# Patient Record
Sex: Female | Born: 1965 | Race: Black or African American | Hispanic: No | Marital: Single | Smoking: Heavy tobacco smoker
Health system: Southern US, Community
[De-identification: ages and names within clinical notes are randomized; demographics above are authoritative.]

## PROBLEM LIST (undated history)

## (undated) DIAGNOSIS — I10 Essential (primary) hypertension: Secondary | ICD-10-CM

## (undated) HISTORY — PX: SKIN GRAFT: SHX250

## (undated) HISTORY — PX: KNEE SURGERY: SHX244

---

## 2019-09-23 ENCOUNTER — Ambulatory Visit: Payer: Self-pay | Attending: Internal Medicine

## 2019-09-23 ENCOUNTER — Other Ambulatory Visit: Payer: Self-pay

## 2019-09-23 DIAGNOSIS — Z20822 Contact with and (suspected) exposure to covid-19: Secondary | ICD-10-CM | POA: Insufficient documentation

## 2019-09-24 LAB — NOVEL CORONAVIRUS, NAA: SARS-CoV-2, NAA: NOT DETECTED

## 2020-07-29 ENCOUNTER — Other Ambulatory Visit: Payer: Self-pay

## 2020-07-29 ENCOUNTER — Encounter (HOSPITAL_COMMUNITY): Payer: Self-pay

## 2020-07-29 ENCOUNTER — Ambulatory Visit (HOSPITAL_COMMUNITY)
Admission: EM | Admit: 2020-07-29 | Discharge: 2020-07-29 | Disposition: A | Discharge status: Home / Self Care | Payer: Self-pay | Attending: Emergency Medicine | Admitting: Emergency Medicine

## 2020-07-29 DIAGNOSIS — S39012A Strain of muscle, fascia and tendon of lower back, initial encounter: Secondary | ICD-10-CM | POA: Insufficient documentation

## 2020-07-29 DIAGNOSIS — R030 Elevated blood-pressure reading, without diagnosis of hypertension: Secondary | ICD-10-CM | POA: Insufficient documentation

## 2020-07-29 DIAGNOSIS — N898 Other specified noninflammatory disorders of vagina: Secondary | ICD-10-CM | POA: Insufficient documentation

## 2020-07-29 DIAGNOSIS — M545 Low back pain, unspecified: Secondary | ICD-10-CM | POA: Insufficient documentation

## 2020-07-29 DIAGNOSIS — M549 Dorsalgia, unspecified: Secondary | ICD-10-CM

## 2020-07-29 LAB — POCT URINALYSIS DIPSTICK, ED / UC
Bilirubin Urine: NEGATIVE
Glucose, UA: NEGATIVE mg/dL
Ketones, ur: NEGATIVE mg/dL
Leukocytes,Ua: NEGATIVE
Nitrite: NEGATIVE
Protein, ur: 100 mg/dL — AB
Specific Gravity, Urine: 1.025 (ref 1.005–1.030)
Urobilinogen, UA: 0.2 mg/dL (ref 0.0–1.0)
pH: 6.5 (ref 5.0–8.0)

## 2020-07-29 MED ORDER — CYCLOBENZAPRINE HCL 10 MG PO TABS: 10.0000 mg | ORAL_TABLET | Freq: Three times a day (TID) | ORAL | 0 refills | Status: AC | PRN

## 2020-07-29 NOTE — ED Triage Notes (Signed)
Pt presents with ongoing back pain X 2 weeks non injury related.

## 2020-07-29 NOTE — ED Provider Notes (Signed)
[facility]    CSN: [id number] Arrival date & time: 07/29/20  1749      History   Chief Complaint Chief Complaint  Patient presents with  . Back Pain    HPI Darlene Soto is a 54 y.o. female.   54 year old African-American female presents to urgent care with chief complaint of lower back pain x2 weeks. No known injury but works in housekeeping, with lots of lifting turning bending twisting. Patient also reports she has a brownish discharge:  denies new sexual partners, denies dysuria, burning, lesions.   The history is provided by the patient. No language interpreter was used.    History reviewed. No pertinent past medical history.  Patient Active Problem List   Diagnosis Date Noted  . Strain of lumbar region 07/29/2020  . Acute bilateral low back pain without sciatica 07/29/2020  . Vaginal discharge 07/29/2020    Past Surgical History:  Procedure Laterality Date  . CESAREAN SECTION    . KNEE SURGERY    . SKIN GRAFT      OB History   No obstetric history on file.      Home Medications    Prior to Admission medications   Medication Sig Start Date End Date Taking? Authorizing Provider  cyclobenzaprine (FLEXERIL) 10 MG tablet Take 1 tablet (10 mg total) by mouth 3 (three) times daily as needed for up to 3 days for muscle spasms. 07/29/20 08/01/20  Jacek Colson, Para March, NP    Family History Family History  Family history unknown: Yes    Social History Social History   Tobacco Use  . Smoking status: Heavy Tobacco Smoker     Allergies   Patient has no known allergies.   Review of Systems Review of Systems  Constitutional: Negative for chills and fever.  HENT: Negative for ear pain and sore throat.   Eyes: Negative for pain and visual disturbance.  Respiratory: Negative for cough and shortness of breath.   Cardiovascular: Negative for chest pain and palpitations.  Gastrointestinal: Negative for abdominal pain and vomiting.   Genitourinary: Positive for vaginal discharge. Negative for dysuria and hematuria.  Musculoskeletal: Positive for back pain and myalgias. Negative for arthralgias and gait problem.  Skin: Negative for color change and rash.  Neurological: Negative for seizures and syncope.  All other systems reviewed and are negative.    Physical Exam Triage Vital Signs ED Triage Vitals  Enc Vitals Group     BP 07/29/20 1155 (!) 172/92     Pulse Rate 07/29/20 1155 100     Resp 07/29/20 1155 20     Temp 07/29/20 1155 97.8 F (36.6 C)     Temp Source 07/29/20 1155 Oral     SpO2 07/29/20 1155 99 %     Weight --      Height --      Head Circumference --      Peak Flow --      Pain Score 07/29/20 1151 6     Pain Loc --      Pain Edu? --      Excl. in GC? --    No data found.  Updated Vital Signs BP (!) 172/92 (BP Location: Right Arm)   Pulse 100   Temp 97.8 F (36.6 C) (Oral)   Resp 20   SpO2 99%   Visual Acuity  Physical Exam Vitals and nursing note reviewed.  Constitutional:      General: She is not in acute distress.  Appearance: She is well-developed and well-nourished.  HENT:     Head: Normocephalic and atraumatic.  Eyes:     Conjunctiva/sclera: Conjunctivae normal.  Cardiovascular:     Rate and Rhythm: Normal rate and regular rhythm.     Heart sounds: No murmur heard.   Pulmonary:     Effort: Pulmonary effort is normal. No respiratory distress.     Breath sounds: Normal breath sounds.  Abdominal:     Palpations: Abdomen is soft.     Tenderness: There is no abdominal tenderness.  Genitourinary:    Comments: Pt self swabbed denies abd pain, exam deferred  Musculoskeletal:        General: No edema.     Cervical back: Normal range of motion and neck supple.  Skin:    General: Skin is warm and dry.  Neurological:     General: No focal deficit present.     Mental Status: She is alert.     GCS: GCS eye subscore is 4. GCS verbal subscore is 5. GCS motor subscore is  6.  Psychiatric:        Mood and Affect: Mood and affect normal.        Behavior: Behavior is cooperative.      UC Treatments / Results  Labs (all labs ordered are listed, but only abnormal results are displayed) Labs Reviewed  POCT URINALYSIS DIPSTICK, ED / UC - Abnormal; Notable for the following components:      Result Value   Hgb urine dipstick TRACE (*)    Protein, ur 100 (*)    All other components within normal limits  CERVICOVAGINAL ANCILLARY ONLY    EKG   Radiology No results found.  Procedures Procedures (including critical care time)  Medications Ordered in UC Medications - No data to display  Initial Impression / Assessment and Plan / UC Course  I have reviewed the triage vital signs and the nursing notes.  Pertinent labs & imaging results that were available during my care of the patient were reviewed by me and considered in my medical decision making (see chart for details).  Clinical Course as of 07/29/20 1554  Thu Jul 29, 2020  1300 Leukocytes,Ua: NEGATIVE [JD]    Clinical Course User Index [JD] Taronda Comacho, Para March, NP    DDx: Back pain, Vaginal discharge, Dysuria Final Clinical Impressions(s) / UC Diagnoses   Final diagnoses:  Strain of lumbar region, initial encounter  Acute bilateral low back pain without sciatica  Vaginal discharge  Elevated blood pressure reading     Discharge Instructions     Your tests are pending. Avoid sexual activity until resulted. Your urine was negative for infection. Please take OTC tylenol/ibuprofen for back pain, may also use lidocaine patch OTC for pain. Have added muscle relaxer as well. Ice or heat to area. Follow up with PCP.     ED Prescriptions    Medication Sig Dispense Auth. Provider   cyclobenzaprine (FLEXERIL) 10 MG tablet Take 1 tablet (10 mg total) by mouth 3 (three) times daily as needed for up to 3 days for muscle spasms. 9 tablet Zelene Barga, Para March, NP     PDMP not reviewed this  encounter.   Clancy Gourd, NP 07/29/20 1554

## 2020-07-29 NOTE — Discharge Instructions (Addendum)
Your tests are pending. Avoid sexual activity until resulted. Your urine was negative for infection. Please take OTC tylenol/ibuprofen for back pain, may also use lidocaine patch OTC for pain. Have added muscle relaxer as well. Ice or heat to area. Follow up with PCP.

## 2020-07-30 ENCOUNTER — Telehealth (HOSPITAL_COMMUNITY): Payer: Self-pay | Admitting: Emergency Medicine

## 2020-07-30 LAB — CERVICOVAGINAL ANCILLARY ONLY
Bacterial Vaginitis (gardnerella): POSITIVE — AB
Candida Glabrata: NEGATIVE
Candida Vaginitis: NEGATIVE
Chlamydia: NEGATIVE
Comment: NEGATIVE
Comment: NEGATIVE
Comment: NEGATIVE
Comment: NEGATIVE
Comment: NEGATIVE
Comment: NORMAL
Neisseria Gonorrhea: NEGATIVE
Trichomonas: NEGATIVE

## 2020-07-30 MED ORDER — METRONIDAZOLE 500 MG PO TABS: 500.0000 mg | ORAL_TABLET | Freq: Two times a day (BID) | ORAL | 0 refills | Status: DC

## 2020-08-04 ENCOUNTER — Telehealth (HOSPITAL_COMMUNITY): Payer: Self-pay | Admitting: Emergency Medicine

## 2020-08-04 MED ORDER — METRONIDAZOLE 500 MG PO TABS: 500.0000 mg | ORAL_TABLET | Freq: Two times a day (BID) | ORAL | 0 refills | Status: DC

## 2020-08-04 NOTE — Telephone Encounter (Signed)
Patient wanted prescription sent to a different pharmacy 

## 2021-03-29 ENCOUNTER — Ambulatory Visit
Admission: EM | Admit: 2021-03-29 | Discharge: 2021-03-29 | Disposition: A | Discharge status: Home / Self Care | Payer: 59 | Attending: Urgent Care | Admitting: Urgent Care

## 2021-03-29 ENCOUNTER — Other Ambulatory Visit: Payer: Self-pay

## 2021-03-29 DIAGNOSIS — Z87828 Personal history of other (healed) physical injury and trauma: Secondary | ICD-10-CM

## 2021-03-29 DIAGNOSIS — L03213 Periorbital cellulitis: Secondary | ICD-10-CM

## 2021-03-29 DIAGNOSIS — H5713 Ocular pain, bilateral: Secondary | ICD-10-CM

## 2021-03-29 MED ORDER — CEFDINIR 300 MG PO CAPS: 300.0000 mg | ORAL_CAPSULE | Freq: Two times a day (BID) | ORAL | 0 refills | Status: DC
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
## 2021-03-30 DIAGNOSIS — H2513 Age-related nuclear cataract, bilateral: Secondary | ICD-10-CM | POA: Diagnosis not present

## 2021-03-30 DIAGNOSIS — H40053 Ocular hypertension, bilateral: Secondary | ICD-10-CM | POA: Diagnosis not present

## 2021-03-30 DIAGNOSIS — H1013 Acute atopic conjunctivitis, bilateral: Secondary | ICD-10-CM | POA: Diagnosis not present

## 2021-04-09 ENCOUNTER — Other Ambulatory Visit: Payer: Self-pay

## 2021-04-09 ENCOUNTER — Encounter (HOSPITAL_COMMUNITY): Payer: Self-pay | Admitting: Emergency Medicine

## 2021-04-09 ENCOUNTER — Ambulatory Visit (HOSPITAL_COMMUNITY)
Admission: EM | Admit: 2021-04-09 | Discharge: 2021-04-09 | Disposition: A | Discharge status: Home / Self Care | Payer: 59 | Attending: Student | Admitting: Student

## 2021-04-09 DIAGNOSIS — R030 Elevated blood-pressure reading, without diagnosis of hypertension: Secondary | ICD-10-CM | POA: Diagnosis not present

## 2021-04-09 DIAGNOSIS — S39012A Strain of muscle, fascia and tendon of lower back, initial encounter: Secondary | ICD-10-CM | POA: Diagnosis not present

## 2021-04-09 HISTORY — DX: Essential (primary) hypertension: I10

## 2021-04-09 MED ORDER — TIZANIDINE HCL 2 MG PO TABS: 2.0000 mg | ORAL_TABLET | Freq: Three times a day (TID) | ORAL | 0 refills | Status: DC | PRN

## 2021-04-09 MED ORDER — METHYLPREDNISOLONE SODIUM SUCC 125 MG IJ SOLR
60.0000 mg | Freq: Once | INTRAMUSCULAR | Status: AC
  Administered 2021-04-09: 60 mg via INTRAMUSCULAR

## 2021-04-09 MED ORDER — METHYLPREDNISOLONE SODIUM SUCC 125 MG IJ SOLR
INTRAMUSCULAR | Status: AC
  Filled 2021-04-09: qty 2

## 2021-04-09 NOTE — Discharge Instructions (Addendum)
-  Start the muscle relaxer-Zanaflex (tizanidine), up to 3 times daily for muscle spasms and pain.  This can make you drowsy, so take at bedtime or when you do not need to drive or operate machinery. -Tylenol/ibuprofen, rest, heating pad -I placed a PCP referral for you, somebody should call you to set this up -Please check your blood pressure at home or at the pharmacy. If this continues to be >140/90, follow-up with your primary care provider for further blood pressure management/ medication titration. If you develop chest pain, shortness of breath, vision changes, the worst headache of your life- head straight to the ED or call 911.

## 2021-04-09 NOTE — ED Triage Notes (Signed)
Patient c/o bilateral lower back pain x 5 days.   Patient denies fall or trauma.   Patient endorses pain is radiating down RT leg.   Patient took Advil and Goody powder w/ no relief of symptoms.

## 2021-04-09 NOTE — ED Provider Notes (Signed)
[facility]    CSN: [id number] Arrival date & time: 04/09/21  1116      History   Chief Complaint Chief Complaint  Patient presents with   Back Pain    HPI Darlene Soto is a 55 y.o. female presenting with back pain x4 days. Medical history back pain.  Denies trauma, but does endorse prolonged standing at a job.  Goody powder providing minimal relief. Denies numbness in arms/legs, denies weakness in arms/legs, denies saddle anesthesia, denies bowel/bladder incontinence, denies urinary retention, denies constipation, denies urinary symptoms.    HPI  Past Medical History:  Diagnosis Date   Hypertension     Patient Active Problem List   Diagnosis Date Noted   Strain of lumbar region 07/29/2020   Acute bilateral low back pain without sciatica 07/29/2020   Vaginal discharge 07/29/2020    Past Surgical History:  Procedure Laterality Date   CESAREAN SECTION     KNEE SURGERY     SKIN GRAFT      OB History   No obstetric history on file.      Home Medications    Prior to Admission medications   Medication Sig Start Date End Date Taking? Authorizing Provider  tiZANidine (ZANAFLEX) 2 MG tablet Take 1 tablet (2 mg total) by mouth every 8 (eight) hours as needed for muscle spasms. 04/09/21  Yes Rhys Martini, PA-C    Family History Family History  Family history unknown: Yes    Social History Social History   Tobacco Use   Smoking status: Heavy Smoker   Smokeless tobacco: Never     Allergies   Patient has no known allergies.   Review of Systems Review of Systems  Constitutional:  Negative for chills, fever and unexpected weight change.  Respiratory:  Negative for chest tightness and shortness of breath.   Cardiovascular:  Negative for chest pain and palpitations.  Gastrointestinal:  Negative for abdominal pain, diarrhea, nausea and vomiting.  Genitourinary:  Negative for decreased urine volume, difficulty urinating and frequency.   Musculoskeletal:  Positive for back pain. Negative for arthralgias, gait problem, joint swelling, myalgias, neck pain and neck stiffness.  Skin:  Negative for wound.  Neurological:  Negative for dizziness, tremors, seizures, syncope, facial asymmetry, speech difficulty, weakness, light-headedness, numbness and headaches.  All other systems reviewed and are negative.   Physical Exam Triage Vital Signs ED Triage Vitals  Enc Vitals Group     BP 04/09/21 1247 (!) 179/104     Pulse Rate 04/09/21 1247 92     Resp 04/09/21 1247 15     Temp 04/09/21 1249 98.4 F (36.9 C)     Temp src --      SpO2 04/09/21 1247 98 %     Weight --      Height --      Head Circumference --      Peak Flow --      Pain Score 04/09/21 1244 10     Pain Loc --      Pain Edu? --      Excl. in GC? --    No data found.  Updated Vital Signs BP (!) 170/96 (BP Location: Left Arm)   Pulse 92   Temp 98.4 F (36.9 C)   Resp 15   SpO2 98%   Visual Acuity Right Eye Distance:   Left Eye Distance:   Bilateral Distance:    Right Eye Near:   Left Eye Near:    Bilateral Near:  Physical Exam Vitals reviewed.  Constitutional:      General: She is not in acute distress.    Appearance: Normal appearance. She is not ill-appearing.  HENT:     Head: Normocephalic and atraumatic.  Cardiovascular:     Rate and Rhythm: Normal rate and regular rhythm.     Heart sounds: Normal heart sounds.  Pulmonary:     Effort: Pulmonary effort is normal.     Breath sounds: Normal breath sounds and air entry.  Abdominal:     Tenderness: There is no abdominal tenderness. There is no right CVA tenderness, left CVA tenderness, guarding or rebound.  Musculoskeletal:     Cervical back: Normal range of motion. No swelling, deformity, signs of trauma, rigidity, spasms, tenderness, bony tenderness or crepitus. No pain with movement.     Thoracic back: No swelling, deformity, signs of trauma, spasms, tenderness or bony tenderness.  Normal range of motion. No scoliosis.     Lumbar back: No swelling, deformity, signs of trauma, spasms, tenderness or bony tenderness. Normal range of motion. Negative right straight leg raise test and negative left straight leg raise test. No scoliosis.     Comments: Bilateral lumbar paraspinous muscle tenderness to palpation, pain elicited with flexion lumbar spine.  Positive right straight leg raise.  Strength and sensation intact upper and lower extremities.  No saddle anesthesia No midline spinous tenderness, deformity, stepoff.  Absolutely no other injury, deformity, tenderness, ecchymosis, abrasion.  Neurological:     General: No focal deficit present.     Mental Status: She is alert.     Cranial Nerves: No cranial nerve deficit.  Psychiatric:        Mood and Affect: Mood normal.        Behavior: Behavior normal.        Thought Content: Thought content normal.        Judgment: Judgment normal.     UC Treatments / Results  Labs (all labs ordered are listed, but only abnormal results are displayed) Labs Reviewed - No data to display  EKG   Radiology No results found.  Procedures Procedures (including critical care time)  Medications Ordered in UC Medications  methylPREDNISolone sodium succinate (SOLU-MEDROL) 125 mg/2 mL injection 60 mg (has no administration in time range)    Initial Impression / Assessment and Plan / UC Course  I have reviewed the triage vital signs and the nursing notes.  Pertinent labs & imaging results that were available during my care of the patient were reviewed by me and considered in my medical decision making (see chart for details).     This patient is a very pleasant 55 y.o. year old female presenting with lumbar strain with R sciatica, and elevated BP. Denies headache, chest pain, vision changes, dizziness. No red flag symptoms  Solumedrol IM today. Zanaflex, tylenol/ibuprofen.   Monitor BP at home. PCP referral placed.   ED return  precautions discussed. Patient verbalizes understanding and agreement.    Final Clinical Impressions(s) / UC Diagnoses   Final diagnoses:  Strain of lumbar region, initial encounter  Elevated blood pressure reading in office without diagnosis of hypertension     Discharge Instructions      -Start the muscle relaxer-Zanaflex (tizanidine), up to 3 times daily for muscle spasms and pain.  This can make you drowsy, so take at bedtime or when you do not need to drive or operate machinery. -Tylenol/ibuprofen, rest, heating pad -I placed a PCP referral for you, somebody should call  you to set this up -Please check your blood pressure at home or at the pharmacy. If this continues to be >140/90, follow-up with your primary care provider for further blood pressure management/ medication titration. If you develop chest pain, shortness of breath, vision changes, the worst headache of your life- head straight to the ED or call 911.      ED Prescriptions     Medication Sig Dispense Auth. Provider   tiZANidine (ZANAFLEX) 2 MG tablet Take 1 tablet (2 mg total) by mouth every 8 (eight) hours as needed for muscle spasms. 21 tablet Rhys Martini, PA-C      PDMP not reviewed this encounter.   Rhys Martini, PA-C 04/09/21 1318

## 2021-04-12 ENCOUNTER — Encounter (HOSPITAL_COMMUNITY): Payer: Self-pay | Admitting: Emergency Medicine

## 2021-05-03 ENCOUNTER — Ambulatory Visit (INDEPENDENT_AMBULATORY_CARE_PROVIDER_SITE_OTHER): Payer: 59

## 2021-05-03 ENCOUNTER — Other Ambulatory Visit: Payer: Self-pay

## 2021-05-03 ENCOUNTER — Ambulatory Visit
Admission: EM | Admit: 2021-05-03 | Discharge: 2021-05-03 | Disposition: A | Discharge status: Home / Self Care | Payer: 59 | Attending: Internal Medicine | Admitting: Internal Medicine

## 2021-05-03 DIAGNOSIS — N898 Other specified noninflammatory disorders of vagina: Secondary | ICD-10-CM

## 2021-05-03 DIAGNOSIS — R350 Frequency of micturition: Secondary | ICD-10-CM | POA: Diagnosis not present

## 2021-05-03 DIAGNOSIS — B373 Candidiasis of vulva and vagina: Secondary | ICD-10-CM | POA: Diagnosis not present

## 2021-05-03 DIAGNOSIS — M5441 Lumbago with sciatica, right side: Secondary | ICD-10-CM | POA: Insufficient documentation

## 2021-05-03 DIAGNOSIS — M545 Low back pain, unspecified: Secondary | ICD-10-CM | POA: Diagnosis not present

## 2021-05-03 DIAGNOSIS — R300 Dysuria: Secondary | ICD-10-CM | POA: Diagnosis present

## 2021-05-03 LAB — POCT URINALYSIS DIP (MANUAL ENTRY)
Bilirubin, UA: NEGATIVE
Blood, UA: NEGATIVE
Glucose, UA: NEGATIVE mg/dL
Ketones, POC UA: NEGATIVE mg/dL
Leukocytes, UA: NEGATIVE
Nitrite, UA: NEGATIVE
Protein Ur, POC: 30 mg/dL — AB
Spec Grav, UA: >=1.03 — AB (ref 1.010–1.025)
Urobilinogen, UA: 0.2 E.U./dL
pH, UA: 5.5 (ref 5.0–8.0)

## 2021-05-03 MED ORDER — PREDNISONE 20 MG PO TABS: 40.0000 mg | ORAL_TABLET | Freq: Every day | ORAL | 0 refills | Status: AC

## 2021-05-03 NOTE — Discharge Instructions (Addendum)
You have been prescribed prednisone steroid to help decrease inflammation associated with back pain.  Please call provided contact information for spine specialist for further evaluation and management.  Your urine did not show signs of infection.  Will await urine culture and vaginal swab for treatment.  We will call you if there are any abnormalities.

## 2021-05-03 NOTE — ED Triage Notes (Signed)
One month h/o intermittent low back pain. Has been taking muscle relaxers with temporary relief.  One week h/o vaginal discharge that is tan and thicker than normal for her. Also c/o vaginal itching. Confirms dysuria, urinary frequency and urgency. Denies hematuria. Has been taking monistat without relief.

## 2021-05-03 NOTE — ED Provider Notes (Signed)
[facility]    CSN: [id number] Arrival date & time: 05/03/21  1101      History   Chief Complaint Chief Complaint  Patient presents with   Back Pain   Vaginal Itching   Vaginal Discharge    HPI Darlene Soto is a 55 y.o. female.   Patient presents with 1 week history of vaginal discharge and dysuria.  Vaginal discharge is described as a tan color and is "thicker than normal".  Denies any urinary frequency, abdominal pain, fever, urinary or bowel incontinence, hematuria.  Been using Monistat with no relief of symptoms.  Denies any known exposure to STD and does not have any concerns for STD.  Has had unprotected sexual intercourse recently.  Denies that sexual partner has reported any similar symptoms.  Patient no longer has menstrual cycles.  Also presents with 1 month of right lower back pain.  Was seen on 04/11/2021 for same back pain.  Back pain does radiate down right leg.  Denies any numbness or tingling.  Was prescribed tizanidine and given Solu-Medrol injection.  Patient reports that back pain improved for 2 days but then returned.  Patient has been using tizanidine with no relief of pain.  Pain is described as intermittent.  Has history of lower muscle strain in that area of the back.  Denies any recent or past injury.   Back Pain Vaginal Itching  Vaginal Discharge Associated symptoms: vaginal itching    Past Medical History:  Diagnosis Date   Hypertension     Patient Active Problem List   Diagnosis Date Noted   Strain of lumbar region 07/29/2020   Acute bilateral low back pain without sciatica 07/29/2020   Vaginal discharge 07/29/2020    Past Surgical History:  Procedure Laterality Date   CESAREAN SECTION     KNEE SURGERY     SKIN GRAFT      OB History   No obstetric history on file.      Home Medications    Prior to Admission medications   Medication Sig Start Date End Date Taking? Authorizing Provider  predniSONE (DELTASONE) 20 MG  tablet Take 2 tablets (40 mg total) by mouth daily for 5 days. 05/03/21 05/08/21 Yes Lance Muss, FNP  cefdinir (OMNICEF) 300 MG capsule Take 1 capsule (300 mg total) by mouth 2 (two) times daily. 03/29/21   Wallis Bamberg, PA-C  tiZANidine (ZANAFLEX) 2 MG tablet Take 1 tablet (2 mg total) by mouth every 8 (eight) hours as needed for muscle spasms. 04/09/21   Rhys Martini, PA-C    Family History Family History  Family history unknown: Yes    Social History Social History   Tobacco Use   Smoking status: Heavy Smoker    Types: Cigarettes   Smokeless tobacco: Never  Substance Use Topics   Alcohol use: Yes   Drug use: Not Currently     Allergies   Patient has no known allergies.   Review of Systems Review of Systems Per HPI  Physical Exam Triage Vital Signs ED Triage Vitals  Enc Vitals Group     BP 05/03/21 1231 (!) 166/89     Pulse Rate 05/03/21 1231 82     Resp 05/03/21 1231 18     Temp 05/03/21 1231 98 F (36.7 C)     Temp Source 05/03/21 1231 Oral     SpO2 05/03/21 1231 96 %     Weight --      Height --  Head Circumference --      Peak Flow --      Pain Score 05/03/21 1233 10     Pain Loc --      Pain Edu? --      Excl. in [place]? --    No data found.  Updated Vital Signs BP (!) 166/89 (BP Location: Left Arm)   Pulse 82   Temp 98 F (36.7 C) (Oral)   Resp 18   SpO2 96%   Visual Acuity Right Eye Distance:   Left Eye Distance:   Bilateral Distance:    Right Eye Near:   Left Eye Near:    Bilateral Near:     Physical Exam Constitutional:      Appearance: Normal appearance.  HENT:     Head: Normocephalic and atraumatic.  Eyes:     Extraocular Movements: Extraocular movements intact.     Conjunctiva/sclera: Conjunctivae normal.  Cardiovascular:     Rate and Rhythm: Normal rate and regular rhythm.     Pulses: Normal pulses.     Heart sounds: Normal heart sounds.  Pulmonary:     Effort: Pulmonary effort is normal.     Breath sounds: Normal  breath sounds.  Abdominal:     General: Bowel sounds are normal. There is no distension.     Palpations: Abdomen is soft.     Tenderness: There is no abdominal tenderness.  Genitourinary:    Comments: Deferred with shared decision making.  Self swab performed. Musculoskeletal:     Cervical back: Normal.     Thoracic back: Normal.     Lumbar back: Tenderness present. No bony tenderness.       Back:     Comments: Tenderness palpation to right lower lumbar region.  Neurovascular intact.  Neurological:     General: No focal deficit present.     Mental Status: She is alert and oriented to person, place, and time. Mental status is at baseline.     Deep Tendon Reflexes: Reflexes are normal and symmetric.  Psychiatric:        Mood and Affect: Mood normal.        Behavior: Behavior normal.        Thought Content: Thought content normal.        Judgment: Judgment normal.     UC Treatments / Results  Labs (all labs ordered are listed, but only abnormal results are displayed) Labs Reviewed  POCT URINALYSIS DIP (MANUAL ENTRY) - Abnormal; Notable for the following components:      Result Value   Spec Grav, UA >=1.030 (*)    Protein Ur, POC =30 (*)    All other components within normal limits  URINE CULTURE  CERVICOVAGINAL ANCILLARY ONLY    EKG   Radiology DG Lumbar Spine Complete  Result Date: 05/03/2021 CLINICAL DATA:  Intermittent low back pain for 1 month. No reported injury. EXAM: LUMBAR SPINE - COMPLETE 4+ VIEW COMPARISON:  None. FINDINGS: This report assumes 5 non rib-bearing lumbar vertebrae. Lumbar vertebral body heights are preserved, with no fracture. Mild multilevel lumbar degenerative disc disease, most prominent at L4-5. Minimal 2 mm anterolisthesis at L4-5. Mild bilateral lower lumbar facet arthropathy. No aggressive appearing focal osseous lesions. Abdominal aortic atherosclerosis. IMPRESSION: 1. Mild multilevel lumbar degenerative disc disease, most prominent at L4-5.  2. Minimal 2 mm anterolisthesis at L4-5. 3. Mild lower lumbar facet arthropathy. Electronically Signed   By: Delbert Phenix M.D.   On: 05/03/2021 13:42    Procedures Procedures (  including critical care time)  Medications Ordered in UC Medications - No data to display  Initial Impression / Assessment and Plan / UC Course  I have reviewed the triage vital signs and the nursing notes.  Pertinent labs & imaging results that were available during my care of the patient were reviewed by me and considered in my medical decision making (see chart for details).     Will treat low back pain with prednisone steroid course due to being refractory to NSAIDs and muscle relaxers.  X-ray showing multiple degenerative changes.  Patient provided contact information for spine specialist to have follow-up and further management.  No red flags seen on exam.  Urinalysis not showing signs of urinary tract infection.  Urine culture is pending.  Highly suspicious for BV or vaginal yeast.  Will await cervicovaginal swab for treatment.Discussed strict return precautions. Patient verbalized understanding and is agreeable with plan.  Final Clinical Impressions(s) / UC Diagnoses   Final diagnoses:  Urinary frequency  Acute right-sided low back pain with right-sided sciatica  Vaginal discharge  Dysuria     Discharge Instructions      You have been prescribed prednisone steroid to help decrease inflammation associated with back pain.  Please call provided contact information for spine specialist for further evaluation and management.  Your urine did not show signs of infection.  Will await urine culture and vaginal swab for treatment.  We will call you if there are any abnormalities.     ED Prescriptions     Medication Sig Dispense Auth. Provider   predniSONE (DELTASONE) 20 MG tablet Take 2 tablets (40 mg total) by mouth daily for 5 days. 10 tablet Lance Muss, FNP      PDMP not reviewed this  encounter.   Lance Muss, FNP 05/03/21 1409

## 2021-05-04 ENCOUNTER — Telehealth (HOSPITAL_COMMUNITY): Payer: Self-pay | Admitting: Emergency Medicine

## 2021-05-04 LAB — CERVICOVAGINAL ANCILLARY ONLY
Bacterial Vaginitis (gardnerella): NEGATIVE
Candida Glabrata: NEGATIVE
Candida Vaginitis: POSITIVE — AB
Chlamydia: NEGATIVE
Comment: NEGATIVE
Comment: NEGATIVE
Comment: NEGATIVE
Comment: NEGATIVE
Comment: NEGATIVE
Comment: NORMAL
Neisseria Gonorrhea: NEGATIVE
Trichomonas: NEGATIVE

## 2021-05-04 LAB — URINE CULTURE: Culture: NO GROWTH

## 2021-05-04 MED ORDER — FLUCONAZOLE 150 MG PO TABS: 150.0000 mg | ORAL_TABLET | Freq: Once | ORAL | 0 refills | Status: AC

## 2021-05-06 ENCOUNTER — Telehealth (HOSPITAL_COMMUNITY): Payer: Self-pay | Admitting: Emergency Medicine

## 2021-05-06 MED ORDER — FLUCONAZOLE 150 MG PO TABS: 150.0000 mg | ORAL_TABLET | Freq: Once | ORAL | 0 refills | Status: AC

## 2021-05-13 DIAGNOSIS — R030 Elevated blood-pressure reading, without diagnosis of hypertension: Secondary | ICD-10-CM | POA: Diagnosis not present

## 2021-05-13 DIAGNOSIS — G8929 Other chronic pain: Secondary | ICD-10-CM | POA: Diagnosis not present

## 2021-05-13 DIAGNOSIS — M5441 Lumbago with sciatica, right side: Secondary | ICD-10-CM | POA: Diagnosis not present

## 2021-05-13 DIAGNOSIS — Z6829 Body mass index (BMI) 29.0-29.9, adult: Secondary | ICD-10-CM | POA: Diagnosis not present

## 2021-06-28 DIAGNOSIS — H40033 Anatomical narrow angle, bilateral: Secondary | ICD-10-CM | POA: Diagnosis not present

## 2021-06-28 DIAGNOSIS — H18831 Recurrent erosion of cornea, right eye: Secondary | ICD-10-CM | POA: Diagnosis not present

## 2021-07-05 DIAGNOSIS — H18832 Recurrent erosion of cornea, left eye: Secondary | ICD-10-CM | POA: Diagnosis not present

## 2021-07-05 DIAGNOSIS — H40033 Anatomical narrow angle, bilateral: Secondary | ICD-10-CM | POA: Diagnosis not present

## 2021-07-15 ENCOUNTER — Encounter (HOSPITAL_COMMUNITY): Payer: Self-pay

## 2021-07-15 ENCOUNTER — Ambulatory Visit (HOSPITAL_COMMUNITY)
Admission: EM | Admit: 2021-07-15 | Discharge: 2021-07-15 | Disposition: A | Discharge status: Home / Self Care | Payer: 59 | Attending: Student | Admitting: Student

## 2021-07-15 ENCOUNTER — Other Ambulatory Visit: Payer: Self-pay

## 2021-07-15 DIAGNOSIS — J069 Acute upper respiratory infection, unspecified: Secondary | ICD-10-CM

## 2021-07-15 DIAGNOSIS — F17200 Nicotine dependence, unspecified, uncomplicated: Secondary | ICD-10-CM | POA: Diagnosis not present

## 2021-07-15 DIAGNOSIS — R69 Illness, unspecified: Secondary | ICD-10-CM | POA: Diagnosis not present

## 2021-07-15 MED ORDER — PREDNISONE 20 MG PO TABS: 40.0000 mg | ORAL_TABLET | Freq: Every day | ORAL | 0 refills | Status: AC

## 2021-07-15 NOTE — ED Triage Notes (Signed)
Pt presents with cough, congestion, nasal drainage, headache, and nausea X 5 days.

## 2021-07-15 NOTE — ED Provider Notes (Signed)
[facility]    CSN: [id number] Arrival date & time: 07/15/21  1050      History   Chief Complaint Chief Complaint  Patient presents with   URI    HPI Darlene Soto is a 55 y.o. female presenting with viral syndrome for 5 days.  Medical history current smoker, denies history of pulmonary disease.  Describes nonproductive cough, nasal congestion, fatigue, nausea without vomiting.  Tolerating fluids and food.  She is a current smoker but denies shortness of breath, dizziness, chest pain.  Denies fever/chills, body aches.  Denies known sick contacts.  Has tried over-the-counter cough medicine which is helped her sleep, unsure of the name of this.   HPI  Past Medical History:  Diagnosis Date   Hypertension     Patient Active Problem List   Diagnosis Date Noted   Strain of lumbar region 07/29/2020   Acute bilateral low back pain without sciatica 07/29/2020   Vaginal discharge 07/29/2020    Past Surgical History:  Procedure Laterality Date   CESAREAN SECTION     KNEE SURGERY     SKIN GRAFT      OB History   No obstetric history on file.      Home Medications    Prior to Admission medications   Medication Sig Start Date End Date Taking? Authorizing Provider  predniSONE (DELTASONE) 20 MG tablet Take 2 tablets (40 mg total) by mouth daily for 5 days. Take with breakfast or lunch. Avoid NSAIDs (ibuprofen, etc) while taking this medication. 07/15/21 07/20/21 Yes Hazel Sams, PA-C  tiZANidine (ZANAFLEX) 2 MG tablet Take 1 tablet (2 mg total) by mouth every 8 (eight) hours as needed for muscle spasms. 04/09/21   Hazel Sams, PA-C    Family History Family History  Family history unknown: Yes    Social History Social History   Tobacco Use   Smoking status: Heavy Smoker    Types: Cigarettes   Smokeless tobacco: Never  Substance Use Topics   Alcohol use: Yes   Drug use: Not Currently     Allergies   Patient has no known allergies.   Review  of Systems Review of Systems  Constitutional:  Negative for appetite change, chills and fever.  HENT:  Positive for congestion. Negative for ear pain, rhinorrhea, sinus pressure, sinus pain and sore throat.   Eyes:  Negative for redness and visual disturbance.  Respiratory:  Positive for cough. Negative for chest tightness, shortness of breath and wheezing.   Cardiovascular:  Negative for chest pain and palpitations.  Gastrointestinal:  Negative for abdominal pain, constipation, diarrhea, nausea and vomiting.  Genitourinary:  Negative for dysuria, frequency and urgency.  Musculoskeletal:  Negative for myalgias.  Neurological:  Negative for dizziness, weakness and headaches.  Psychiatric/Behavioral:  Negative for confusion.   All other systems reviewed and are negative.   Physical Exam Triage Vital Signs ED Triage Vitals  Enc Vitals Group     BP 07/15/21 1235 (!) 143/82     Pulse Rate 07/15/21 1234 89     Resp 07/15/21 1234 17     Temp 07/15/21 1234 97.9 F (36.6 C)     Temp Source 07/15/21 1234 Oral     SpO2 07/15/21 1234 100 %     Weight --      Height --      Head Circumference --      Peak Flow --      Pain Score 07/15/21 1233 6  Pain Loc --      Pain Edu? --      Excl. in [place]? --    No data found.  Updated Vital Signs BP (!) 143/82   Pulse 89   Temp 97.9 F (36.6 C) (Oral)   Resp 17   SpO2 100%   Visual Acuity Right Eye Distance:   Left Eye Distance:   Bilateral Distance:    Right Eye Near:   Left Eye Near:    Bilateral Near:     Physical Exam Vitals reviewed.  Constitutional:      General: She is not in acute distress.    Appearance: Normal appearance. She is not ill-appearing.  HENT:     Head: Normocephalic and atraumatic.     Right Ear: Tympanic membrane, ear canal and external ear normal. No tenderness. No middle ear effusion. There is no impacted cerumen. Tympanic membrane is not perforated, erythematous, retracted or bulging.     Left Ear:  Tympanic membrane, ear canal and external ear normal. No tenderness.  No middle ear effusion. There is no impacted cerumen. Tympanic membrane is not perforated, erythematous, retracted or bulging.     Nose: Nose normal. No congestion.     Mouth/Throat:     Mouth: Mucous membranes are moist.     Pharynx: Uvula midline. No oropharyngeal exudate or posterior oropharyngeal erythema.  Eyes:     Extraocular Movements: Extraocular movements intact.     Pupils: Pupils are equal, round, and reactive to light.  Cardiovascular:     Rate and Rhythm: Normal rate and regular rhythm.     Heart sounds: Normal heart sounds.  Pulmonary:     Effort: Pulmonary effort is normal.     Breath sounds: Normal breath sounds. No decreased breath sounds, wheezing, rhonchi or rales.  Abdominal:     Palpations: Abdomen is soft.     Tenderness: There is no abdominal tenderness. There is no guarding or rebound.  Lymphadenopathy:     Cervical: No cervical adenopathy.     Right cervical: No superficial cervical adenopathy.    Left cervical: No superficial cervical adenopathy.  Neurological:     General: No focal deficit present.     Mental Status: She is alert and oriented to person, place, and time.  Psychiatric:        Mood and Affect: Mood normal.        Behavior: Behavior normal.        Thought Content: Thought content normal.        Judgment: Judgment normal.     UC Treatments / Results  Labs (all labs ordered are listed, but only abnormal results are displayed) Labs Reviewed - No data to display  EKG   Radiology No results found.  Procedures Procedures (including critical care time)  Medications Ordered in UC Medications - No data to display  Initial Impression / Assessment and Plan / UC Course  I have reviewed the triage vital signs and the nursing notes.  Pertinent labs & imaging results that were available during my care of the patient were reviewed by me and considered in my medical  decision making (see chart for details).     This patient is a very pleasant 55 y.o. year old female presenting with viral syndrome x5 days. Today this pt is afebrile nontachycardic nontachypneic, oxygenating well on room air, no wheezes rhonchi or rales.   Covid/influenza testing deferred given duration of symptoms.  Prednisone sent as below. Continue OTC medications.  ED return precautions discussed. Patient verbalizes understanding and agreement.     Final Clinical Impressions(s) / UC Diagnoses   Final diagnoses:  Viral URI with cough  Current smoker     Discharge Instructions      -Prednisone, 2 pills taken at the same time for 5 days in a row.  Try taking this earlier in the day as it can give you energy. Avoid NSAIDs like ibuprofen and alleve while taking this medication as they can increase your risk of stomach upset and even GI bleeding when in combination with a steroid. You can continue tylenol (acetaminophen) up to 1000mg  3x daily. -Continue home medications -With a virus, you're typically contagious for 5-7 days, or as long as you're having fevers.       ED Prescriptions     Medication Sig Dispense Auth. Provider   predniSONE (DELTASONE) 20 MG tablet Take 2 tablets (40 mg total) by mouth daily for 5 days. Take with breakfast or lunch. Avoid NSAIDs (ibuprofen, etc) while taking this medication. 10 tablet , PA-C      PDMP not reviewed this encounter.   Rhys Martini, PA-C 07/15/21 1247

## 2021-07-15 NOTE — Discharge Instructions (Addendum)
-  Prednisone, 2 pills taken at the same time for 5 days in a row.  Try taking this earlier in the day as it can give you energy. Avoid NSAIDs like ibuprofen and alleve while taking this medication as they can increase your risk of stomach upset and even GI bleeding when in combination with a steroid. You can continue tylenol (acetaminophen) up to 1000mg  3x daily. -Continue home medications -With a virus, you're typically contagious for 5-7 days, or as long as you're having fevers.

## 2021-08-11 DIAGNOSIS — M5441 Lumbago with sciatica, right side: Secondary | ICD-10-CM | POA: Diagnosis not present

## 2021-08-11 DIAGNOSIS — Z6829 Body mass index (BMI) 29.0-29.9, adult: Secondary | ICD-10-CM | POA: Diagnosis not present

## 2021-08-11 DIAGNOSIS — R030 Elevated blood-pressure reading, without diagnosis of hypertension: Secondary | ICD-10-CM | POA: Diagnosis not present

## 2021-08-29 ENCOUNTER — Encounter: Payer: Self-pay | Admitting: Physical Therapy

## 2021-08-29 ENCOUNTER — Ambulatory Visit: Payer: Commercial Managed Care - HMO | Attending: Neurosurgery | Admitting: Physical Therapy

## 2021-08-29 ENCOUNTER — Other Ambulatory Visit: Payer: Self-pay

## 2021-08-29 DIAGNOSIS — M6281 Muscle weakness (generalized): Secondary | ICD-10-CM

## 2021-08-29 DIAGNOSIS — G8929 Other chronic pain: Secondary | ICD-10-CM | POA: Insufficient documentation

## 2021-08-29 DIAGNOSIS — M545 Low back pain, unspecified: Secondary | ICD-10-CM

## 2021-08-29 NOTE — Patient Instructions (Signed)
Access Code: [email or web address] URL: [email or web address] Date: 08/29/2021 Prepared by: Rosana Hoes  Exercises Supine Lower Trunk Rotation - 2 x daily - 7 x weekly - 5 reps - 5 seconds hold Hooklying Single Knee to Chest Stretch - 2 x daily - 7 x weekly - 3 reps - 10 seconds hold Supine Posterior Pelvic Tilt - 2 x daily - 7 x weekly - 2 sets - 5 reps - 5 seconds hold Hooklying Clamshell with Resistance - 2 x daily - 7 x weekly - 2 sets - 5 reps - 5 seconds hold Child's Pose Stretch - 2 x daily - 7 x weekly - 3 reps - 10 hold

## 2021-08-29 NOTE — Therapy (Signed)
OUTPATIENT PHYSICAL THERAPY THORACOLUMBAR EVALUATION   Patient Name: Darlene Soto MRN: [id number] DOB:02-Jul-1966, 56 y.o., female Today's Date: 08/29/2021   PT End of Session - 08/29/21 1715     Visit Number 1    Number of Visits 6    Date for PT Re-Evaluation 10/10/21    Authorization Type [employer]    PT Start Time 1700    PT Stop Time 1745    PT Time Calculation (min) 45 min    Activity Tolerance Patient tolerated treatment well    Behavior During Therapy [facility] for tasks assessed/performed             Past Medical History:  Diagnosis Date   Hypertension    Past Surgical History:  Procedure Laterality Date   CESAREAN SECTION     KNEE SURGERY     SKIN GRAFT     Patient Active Problem List   Diagnosis Date Noted   Strain of lumbar region 07/29/2020   Acute bilateral low back pain without sciatica 07/29/2020   Vaginal discharge 07/29/2020    PCP: Pcp, No  REFERRING PROVIDER: Tressie Stalker, MD  REFERRING DIAG: Lumbago with sciatica, right side  THERAPY DIAG:  Chronic bilateral low back pain, unspecified whether sciatica present  Muscle weakness (generalized)  ONSET DATE: patient reports pain began in 1999 or 2000  SUBJECTIVE:  SUBJECTIVE STATEMENT: Patient reports muscle spasm and low back pain that began in 1999 or 2000. This occurred when she was lifting a TV and she felt like everything tore in her back. She went to urgent care more recently from an increase in pain. Patient states that bending or sitting/standing too long can aggravate her pain. Patient denies any radiating pain, numbness or tingling.  PERTINENT HISTORY:  None  PAIN:  Are you having pain? Yes NPRS scale: 10/10 Pain location: low back Pain orientation: Right, Left, and Lower  PAIN TYPE: Chronic Pain description: stabbing  Aggravating factors: Bending, sitting or standing too long Relieving factors: Hot water with epson salt, medication  PRECAUTIONS: None  WEIGHT BEARING  RESTRICTIONS No  FALLS:  Has patient fallen in last 6 months? Yes  LIVING ENVIRONMENT: Lives with: lives with their family  OCCUPATION: Unemployed  PLOF: Independent  PATIENT GOALS: Pain relief and improve ability to stand, sit, and bend   OBJECTIVE:  DIAGNOSTIC FINDINGS:  X-ray 05/03/21 IMPRESSION: 1. Mild multilevel lumbar degenerative disc disease, most prominent at L4-5. 2. Minimal 2 mm anterolisthesis at L4-5. 3. Mild lower lumbar facet arthropathy.  PATIENT SURVEYS:  FOTO 49% functional status  SCREENING FOR RED FLAGS: Negative  COGNITION: Overall cognitive status: Within functional limits for tasks assessed     SENSATION:  Light touch: Appears intact  MUSCLE LENGTH: Hamstring tightness noted, also limited by onset of low back pain Hip flexor/quad tightness noted, also limited by onset of low back pain  POSTURE:  Increased lumbar lordosis / anterior pelvic tilt  PALPATION: Tender to light palpation bilat lumbar paraspinals  LUMBAR AROM  AROM AROM  08/29/2021  Flexion Fingertips to ankles, minimal reversal of lordosis  Extension < 25%  Right lateral flexion 50%  Left lateral flexion 50%  Right rotation 50%  Left rotation 50%    Lumbar AROM limited secondary to pain  LE AROM/PROM:  Bilateral hip PROM grossly limited secondary to onset of low back pain  LE MMT:  MMT Right 08/29/2021 Left 08/29/2021  Hip flexion 4- 4-  Hip extension 3- 3-  Hip abduction 3- 3-  Knee flexion  4 4  Knee extension 4 4   GAIT: Assistive device utilized: None Level of assistance: Complete [place] Comments: guarded, decreased trunk rotation, increased lumbar lordosis/anterior pelvic tilt   TODAY'S TREATMENT  LTR 5 x 5 sec Supine SKTC stretch 3 x 10 sec each Posterior pelvic tilt 5 x 5 sec Supine clamshell with red 5 x 5 sec Child's pose stretch 3 x 10 sec   PATIENT EDUCATION:  Education details: Exam findings, POC, HEP Person educated:  Patient Education method: Explanation, Demonstration, Tactile cues, Verbal cues, and Handouts Education comprehension: verbalized understanding, returned demonstration, verbal cues required, tactile cues required, and needs further education  HOME EXERCISE PROGRAM: Access Code: [email or web address]   ASSESSMENT: CLINICAL IMPRESSION: Patient is a 56 y.o. female who was seen today for physical therapy evaluation and treatment for chronic low back pain. She demonstrates a high irritability level so examination was limited from pain exacerbation. Patient demonstrates significant strength and coordination deficit of core and rests in anterior pelvic tilt/lordosis likely contributing to low back pain and muscular tightness. Objective impairments include Abnormal gait, decreased activity tolerance, decreased ROM, decreased strength, impaired flexibility, postural dysfunction, and pain. These impairments are limiting patient from cleaning, community activity, driving, meal prep, occupation, laundry, yard work, and shopping. Personal factors including Past/current experiences and Time since onset of injury/illness/exacerbation are also affecting patient's functional outcome. Patient will benefit from skilled PT to address above impairments and improve overall function.  REHAB POTENTIAL: Fair  CLINICAL DECISION MAKING: Stable/uncomplicated  EVALUATION COMPLEXITY: Low   GOALS: Goals reviewed with patient? Yes  SHORT TERM GOALS:  STG Name Target Date Goal status  1 Patient will be I with initial HEP in order to progress with therapy. Baseline: provided at eval  09/19/2021 INITIAL  2 PT will review FOTO with patient by 3rd visit in order to understand expected progress and outcome with therapy. Baseline: assessed at eval 09/19/2021 INITIAL   LONG TERM GOALS:   LTG Name Target Date Goal status  1 Patient will be I with final HEP to maintain progress from PT. Baseline: provided at eval 10/10/2021 INITIAL  2  Patient will report >/= 61% status on FOTO to indicate improved functional ability. Baseline: 49% functional status 10/10/2021 INITIAL  3 Patient will demonstrate gross hip and core strength >/= 4-/5 MMT in order to improve standing tolerance Baseline:grossly 3-/5 MMT 10/10/2021 INITIAL  4 Patient will report pain level </= 5/10 to reduce function limitation with household tasks that incorporate bending Baseline: patient reports 10/10 pain 10/10/2021 INITIAL    PLAN: PT FREQUENCY: 1x/week  PT DURATION: 6 weeks  PLANNED INTERVENTIONS: Therapeutic exercises, Therapeutic activity, Neuro Muscular re-education, Balance training, Gait training, Patient/Family education, Joint mobilization, Aquatic Therapy, Dry Needling, Electrical stimulation, Cryotherapy, Moist heat, Traction, and Manual therapy  PLAN FOR NEXT SESSION: Review HEP and progress PRN, manual/dry needling for lumbar paraspinals, modalities for pain reduction, lumbar/hip stretching, gradual progression of core control and hip strength, postural control   Rosana Hoes, PT, DPT, LAT, ATC 08/29/21  5:50 PM Phone: [phone] Fax: [phone]

## 2021-09-08 ENCOUNTER — Ambulatory Visit: Payer: Commercial Managed Care - HMO | Admitting: Physical Therapy

## 2021-09-09 ENCOUNTER — Ambulatory Visit: Payer: Commercial Managed Care - HMO | Admitting: Physical Therapy

## 2021-09-09 ENCOUNTER — Encounter: Payer: Self-pay | Admitting: Physical Therapy

## 2021-09-09 ENCOUNTER — Other Ambulatory Visit: Payer: Self-pay

## 2021-09-09 DIAGNOSIS — G8929 Other chronic pain: Secondary | ICD-10-CM

## 2021-09-09 DIAGNOSIS — M545 Low back pain, unspecified: Secondary | ICD-10-CM

## 2021-09-09 DIAGNOSIS — M6281 Muscle weakness (generalized): Secondary | ICD-10-CM

## 2021-09-09 NOTE — Patient Instructions (Signed)

## 2021-09-09 NOTE — Therapy (Addendum)
OUTPATIENT PHYSICAL THERAPY TREATMENT NOTE   Patient Name: Darlene Soto MRN: [id number] DOB:July 28, 1966, 56 y.o., female Today's Date: 09/09/2021  PCP: Merryl Hacker, No REFERRING PROVIDER: Newman Pies, MD   PT End of Session - 09/09/21 0947     Visit Number 2    Number of Visits 6    Date for PT Re-Evaluation 10/10/21    Authorization Type [employer]    PT Start Time 1315    PT Stop Time 1400    PT Time Calculation (min) 45 min    Activity Tolerance Patient tolerated treatment well    Behavior During Therapy [facility] for tasks assessed/performed             Past Medical History:  Diagnosis Date   Hypertension    Past Surgical History:  Procedure Laterality Date   CESAREAN SECTION     KNEE SURGERY     SKIN GRAFT     Patient Active Problem List   Diagnosis Date Noted   Strain of lumbar region 07/29/2020   Acute bilateral low back pain without sciatica 07/29/2020   Vaginal discharge 07/29/2020    REFERRING DIAG: Lumbago with sciatica, right side  THERAPY DIAG:  Chronic bilateral low back pain, unspecified whether sciatica present  Muscle weakness (generalized)  PERTINENT HISTORY: None  PRECAUTIONS: None  ONSET DATE: patient reports pain began in 1999 or 2000  SUBJECTIVE: My pain is a 9/10. I did the exercises a little bit. I got something in my eye last Thursday and had to go to the doctor. I haven't done much with the exercises since then.   PAIN:  Are you having pain? Yes NPRS scale: 9/10 Pain location: low back Pain orientation: Right, Left, and Lower  PAIN TYPE: Chronic Pain description: stabbing  Aggravating factors: Bending, sitting or standing too long Relieving factors: Hot water with epson salt, medication   OBJECTIVE:  DIAGNOSTIC FINDINGS:  X-ray 05/03/21 IMPRESSION: 1. Mild multilevel lumbar degenerative disc disease, most prominent at L4-5. 2. Minimal 2 mm anterolisthesis at L4-5. 3. Mild lower lumbar facet arthropathy.   PATIENT SURVEYS:   FOTO 49% functional status   SCREENING FOR RED FLAGS: Negative   COGNITION: Overall cognitive status: Within functional limits for tasks assessed                        SENSATION:          Light touch: Appears intact   MUSCLE LENGTH: Hamstring tightness noted, also limited by onset of low back pain Hip flexor/quad tightness noted, also limited by onset of low back pain   POSTURE:  Increased lumbar lordosis / anterior pelvic tilt   PALPATION: Tender to light palpation bilat lumbar paraspinals   LUMBAR AROM   AROM AROM  08/29/2021  Flexion Fingertips to ankles, minimal reversal of lordosis  Extension < 25%  Right lateral flexion 50%  Left lateral flexion 50%  Right rotation 50%  Left rotation 50%            Lumbar AROM limited secondary to pain   LE AROM/PROM:   Bilateral hip PROM grossly limited secondary to onset of low back pain   LE MMT:   MMT Right 08/29/2021 Left 08/29/2021  Hip flexion 4- 4-  Hip extension 3- 3-  Hip abduction 3- 3-  Knee flexion 4 4  Knee extension 4 4    GAIT: Assistive device utilized: None Level of assistance: Complete Independence Comments: guarded, decreased trunk rotation, increased lumbar lordosis/anterior  pelvic tilt   [facility] Adult PT Treatment:                                                DATE: 09/09/2021 Therapeutic Exercise: Nustep L3 UE/LE x 5 min Seated childs pose vs seated lumbar flexion x 3 Seated Pelvic tilts x 10 -   LTR 10 x 10 sec Supine SKTC stretch 3 x 10 sec each Posterior pelvic tilt 15 x 5 sec- mod cues for correct technique and breathing- cues not to jerk  Supine clamshell with red 15 x 5 sec  Manual Therapy: N/A Neuromuscular re-ed: N/A Therapeutic Activity: N/A Modalities: Electrical Stim :IFC x 15 min to tolerance  HMP x 15 min concurrent with Estim (unable due to hydrocullator being cleaned) Self Care: N/A       Initial  TREATMENT 08/29/2021 LTR 5 x 5 sec Supine SKTC stretch 3 x 10 sec  each Posterior pelvic tilt 5 x 5 sec Supine clamshell with red 5 x 5 sec Child's pose stretch 3 x 10 sec     PATIENT EDUCATION:  Education details: Continue HEP , FOTO , TENS info Person educated: Patient Education method: Explanation, Demonstration, Tactile cues, Verbal cues, and Handouts Education comprehension: verbalized understanding, returned demonstration, verbal cues required, tactile cues required, and needs further education   HOME EXERCISE PROGRAM: Access Code: [email or web address]     ASSESSMENT: CLINICAL IMPRESSION: Pt reports min compliance with HEP. Began Nustep and reviewed HEP.  FOTO score and prediction reviewed with patient. Trial of Estim for pain relief. Pt reported reduced pain after session. She was given info on TENS for possible purchase. Pain reduced from 9/10 to 8/10 post session.      REHAB POTENTIAL: Fair   CLINICAL DECISION MAKING: Stable/uncomplicated   EVALUATION COMPLEXITY: Low     GOALS: Goals reviewed with patient? Yes   SHORT TERM GOALS:   STG Name Target Date Goal status  1 Patient will be I with initial HEP in order to progress with therapy. Baseline: provided at eval  09/19/2021 INITIAL  2 PT will review FOTO with patient by 3rd visit in order to understand expected progress and outcome with therapy. Baseline: assessed at eval 09/19/2021 MET  09/09/21    LONG TERM GOALS:    LTG Name Target Date Goal status  1 Patient will be I with final HEP to maintain progress from PT. Baseline: provided at eval 10/10/2021 INITIAL  2 Patient will report >/= 61% status on FOTO to indicate improved functional ability. Baseline: 49% functional status 10/10/2021 INITIAL  3 Patient will demonstrate gross hip and core strength >/= 4-/5 MMT in order to improve standing tolerance Baseline:grossly 3-/5 MMT 10/10/2021 INITIAL  4 Patient will report pain level </= 5/10 to reduce function limitation with household tasks that incorporate bending Baseline: patient reports  10/10 pain 10/10/2021 INITIAL      PLAN: PT FREQUENCY: 1x/week   PT DURATION: 6 weeks   PLANNED INTERVENTIONS: Therapeutic exercises, Therapeutic activity, Neuro Muscular re-education, Balance training, Gait training, Patient/Family education, Joint mobilization, Aquatic Therapy, Dry Needling, Electrical stimulation, Cryotherapy, Moist heat, Traction, and Manual therapy   PLAN FOR NEXT SESSION: assess response to estim- try HMP, Review HEP and progress PRN, manual/dry needling for lumbar paraspinals, modalities for pain reduction, lumbar/hip stretching, gradual progression of core control and hip strength, postural control  Dorene Ar, PTA 09/09/2021, 2:05 PM    Starr Lake PT, DPT, LAT, ATC  02/28/22  11:48 AM

## 2021-09-14 ENCOUNTER — Ambulatory Visit: Payer: Commercial Managed Care - HMO | Attending: Physical Medicine and Rehabilitation | Admitting: Physical Therapy

## 2021-09-14 ENCOUNTER — Other Ambulatory Visit: Payer: Self-pay

## 2021-09-14 ENCOUNTER — Encounter: Payer: Self-pay | Admitting: Physical Therapy

## 2021-09-14 DIAGNOSIS — G8929 Other chronic pain: Secondary | ICD-10-CM | POA: Diagnosis present

## 2021-09-14 DIAGNOSIS — M545 Low back pain, unspecified: Secondary | ICD-10-CM | POA: Diagnosis not present

## 2021-09-14 DIAGNOSIS — M6281 Muscle weakness (generalized): Secondary | ICD-10-CM | POA: Insufficient documentation

## 2021-09-14 NOTE — Therapy (Signed)
OUTPATIENT PHYSICAL THERAPY TREATMENT NOTE   Patient Name: Darlene Soto MRN: [id number] DOB:Aug 17, 1965, 56 y.o., female Today's Date: 09/14/2021  PCP: Merryl Hacker, No REFERRING PROVIDER: Newman Pies, MD   PT End of Session - 09/14/21 1625     Visit Number 3    Number of Visits 6    Date for PT Re-Evaluation 10/10/21    Authorization Type [employer]    PT Start Time 1615    PT Stop Time 1710    PT Time Calculation (min) 55 min    Activity Tolerance Patient tolerated treatment well    Behavior During Therapy [facility] for tasks assessed/performed              Past Medical History:  Diagnosis Date   Hypertension    Past Surgical History:  Procedure Laterality Date   CESAREAN SECTION     KNEE SURGERY     SKIN GRAFT     Patient Active Problem List   Diagnosis Date Noted   Strain of lumbar region 07/29/2020   Acute bilateral low back pain without sciatica 07/29/2020   Vaginal discharge 07/29/2020    REFERRING DIAG: Lumbago with sciatica, right side  THERAPY DIAG:  Chronic bilateral low back pain, unspecified whether sciatica present  Muscle weakness (generalized)  PERTINENT HISTORY: None  PRECAUTIONS: None  ONSET DATE: patient reports pain began in 1999 or 2000  SUBJECTIVE: Patient reports her eye has still be in so much pain so she has not been able to do her exercises,   PAIN:  Are you having pain? Yes NPRS scale: 9/10 Pain location: Back Pain orientation: Right, Left, and Lower  PAIN TYPE: Chronic Pain description: Nagging Aggravating factors: Bending, sitting or standing too long Relieving factors: Hot water with epson salt, medication   OBJECTIVE:  PATIENT SURVEYS:  FOTO 49% functional status   MUSCLE LENGTH: Hamstring tightness noted, also limited by onset of low back pain Hip flexor/quad tightness noted, also limited by onset of low back pain   POSTURE:  Increased lumbar lordosis / anterior pelvic tilt   PALPATION: Tender to light palpation  bilat lumbar paraspinals   LUMBAR AROM   AROM AROM  08/29/2021  Flexion Fingertips to ankles, minimal reversal of lordosis  Extension < 25%  Right lateral flexion 50%  Left lateral flexion 50%  Right rotation 50%  Left rotation 50%             Lumbar AROM limited secondary to pain   LE AROM/PROM: Bilateral hip PROM grossly limited secondary to onset of low back pain   LE MMT:   MMT Right 08/29/2021 Left 08/29/2021  Hip flexion 4- 4-  Hip extension 3- 3-  Hip abduction 3- 3-  Knee flexion 4 4  Knee extension 4 4    GAIT: Assistive device utilized: None Level of assistance: Complete Independence Comments: guarded, decreased trunk rotation, increased lumbar lordosis/anterior pelvic tilt   TODAY'S TREATMENT:  09/14/2021:  Therapeutic Exercise NuStep L4 x 5 min with UE/LE while taking subjective Seated hamstring stretch 2 x 20 sec each LTR 5 x 10 sec each Supine SKTC stretch 3 x 15 seconds PPT 2 x 5 with 5 sec hold - using belt to pull patient into lumbar lordosis and cueing patient to push lumbar spine into belt Hooklying bent knee fall out alternating x 20 Hooklying clamshell with yellow 10 x 5 sec hold SLR partial range 2 x 5 each Modalities: E-stim: IFC x 15 min to lumbar spine, 80-150, intensity to tolerance  MHP for lumabr spine in combination with e-stim  09/09/2021 Therapeutic Exercise: Nustep L3 UE/LE x 5 min Seated childs pose vs seated lumbar flexion x 3 Seated Pelvic tilts x 10 -   LTR 10 x 10 sec Supine SKTC stretch 3 x 10 sec each Posterior pelvic tilt 15 x 5 sec- mod cues for correct technique and breathing- cues not to jerk  Supine clamshell with red 15 x 5 sec Modalities: Electrical Stim :IFC x 15 min to tolerance  HMP x 15 min concurrent with Estim (unable due to hydrocullator being cleaned)   08/29/2021 (evaluation) LTR 5 x 5 sec Supine SKTC stretch 3 x 10 sec each Posterior pelvic tilt 5 x 5 sec Supine clamshell with red 5 x 5 sec Child's pose  stretch 3 x 10 sec   PATIENT EDUCATION:  Education details: Importance of performing HEP, home TENS unit Person educated: Patient Education method: Explanation, Demonstration, Tactile cues, Verbal cues Education comprehension: verbalized understanding, returned demonstration, verbal cues required, tactile cues required, and needs further education   HOME EXERCISE PROGRAM: Access Code: [email or web address]     ASSESSMENT: CLINICAL IMPRESSION: Patient with poor tolerance for therapy, no adverse effects reported. Patient arrives reporting high level of low back pain and states all exercise increases pain so unable to progress her exercises this visit. Patient has not bee consistent with any HEP due to her eye pain, so she was encouraged to improve consistency in order to be able to progress with exercise. Continued to use modalities post therapy to reduce pain and pain was reinstructed on home TENS use if this is beneficial. Patient did report feeling better post therapy and she would benefit from continued skilled PT in order to progress activity tolerance and reduce pain in order to maximize her functional ability.    GOALS: Goals reviewed with patient? Yes   SHORT TERM GOALS:   STG Name Target Date Goal status  1 Patient will be I with initial HEP in order to progress with therapy. Baseline: patient not consistent with exercises 09/19/2021 ONGOING  2 PT will review FOTO with patient by 3rd visit in order to understand expected progress and outcome with therapy. Baseline: assessed at eval 09/19/2021 MET  09/09/21    LONG TERM GOALS:    LTG Name Target Date Goal status  1 Patient will be I with final HEP to maintain progress from PT. Baseline: provided at eval 10/10/2021 INITIAL  2 Patient will report >/= 61% status on FOTO to indicate improved functional ability. Baseline: 49% functional status 10/10/2021 INITIAL  3 Patient will demonstrate gross hip and core strength >/= 4-/5 MMT in order to improve  standing tolerance Baseline:grossly 3-/5 MMT 10/10/2021 INITIAL  4 Patient will report pain level </= 5/10 to reduce function limitation with household tasks that incorporate bending Baseline: patient reports 10/10 pain 10/10/2021 INITIAL      PLAN: PT FREQUENCY: 1x/week   PT DURATION: 6 weeks   PLANNED INTERVENTIONS: Therapeutic exercises, Therapeutic activity, Neuro Muscular re-education, Balance training, Gait training, Patient/Family education, Joint mobilization, Aquatic Therapy, Dry Needling, Electrical stimulation, Cryotherapy, Moist heat, Traction, and Manual therapy   PLAN FOR NEXT SESSION: assess response to estim- try HMP, Review HEP and progress PRN, manual/dry needling for lumbar paraspinals, modalities for pain reduction, lumbar/hip stretching, gradual progression of core control and hip strength, postural control    Hilda Blades, PT, DPT, LAT, ATC 09/14/21  4:58 PM Phone: [phone] Fax: [phone]

## 2021-09-20 NOTE — Therapy (Incomplete)
OUTPATIENT PHYSICAL THERAPY TREATMENT NOTE   Patient Name: Darlene Soto MRN: [id number] DOB:11-14-1965, 56 y.o., female Today's Date: 09/20/2021  PCP: Merryl Hacker, No REFERRING PROVIDER: Newman Pies, MD      Past Medical History:  Diagnosis Date   Hypertension    Past Surgical History:  Procedure Laterality Date   CESAREAN SECTION     KNEE SURGERY     SKIN GRAFT     Patient Active Problem List   Diagnosis Date Noted   Strain of lumbar region 07/29/2020   Acute bilateral low back pain without sciatica 07/29/2020   Vaginal discharge 07/29/2020    REFERRING DIAG: Lumbago with sciatica, right side  THERAPY DIAG:  No diagnosis found.  PERTINENT HISTORY: None  PRECAUTIONS: None  ONSET DATE: patient reports pain began in 1999 or 2000  SUBJECTIVE: ***  PAIN:  Are you having pain? Yes NPRS scale: 9/10 Pain location: Back Pain orientation: Right, Left, and Lower  PAIN TYPE: Chronic Pain description: Nagging Aggravating factors: Bending, sitting or standing too long Relieving factors: Hot water with epson salt, medication   OBJECTIVE:  PATIENT SURVEYS:  FOTO 49% functional status   MUSCLE LENGTH: Hamstring tightness noted, also limited by onset of low back pain Hip flexor/quad tightness noted, also limited by onset of low back pain   POSTURE:  Increased lumbar lordosis / anterior pelvic tilt   PALPATION: Tender to light palpation bilat lumbar paraspinals   LUMBAR AROM   AROM AROM  08/29/2021  Flexion Fingertips to ankles, minimal reversal of lordosis  Extension < 25%  Right lateral flexion 50%  Left lateral flexion 50%  Right rotation 50%  Left rotation 50%             Lumbar AROM limited secondary to pain   LE AROM/PROM: Bilateral hip PROM grossly limited secondary to onset of low back pain   LE MMT:   MMT Right 08/29/2021 Left 08/29/2021  Hip flexion 4- 4-  Hip extension 3- 3-  Hip abduction 3- 3-  Knee flexion 4 4  Knee extension 4 4     GAIT: Assistive device utilized: None Level of assistance: Complete Independence Comments: guarded, decreased trunk rotation, increased lumbar lordosis/anterior pelvic tilt   TODAY'S TREATMENT:  09/21/2021:  Therapeutic exercise: NuStep L4 x 5 min with UE/LE while taking subjective ***   09/14/2021:  Therapeutic Exercise NuStep L4 x 5 min with UE/LE while taking subjective Seated hamstring stretch 2 x 20 sec each LTR 5 x 10 sec each Supine SKTC stretch 3 x 15 seconds PPT 2 x 5 with 5 sec hold - using belt to pull patient into lumbar lordosis and cueing patient to push lumbar spine into belt Hooklying bent knee fall out alternating x 20 Hooklying clamshell with yellow 10 x 5 sec hold SLR partial range 2 x 5 each Modalities: E-stim: IFC x 15 min to lumbar spine, 80-150, intensity to tolerance MHP for lumabr spine in combination with e-stim  09/09/2021 Therapeutic Exercise: Nustep L3 UE/LE x 5 min Seated childs pose vs seated lumbar flexion x 3 Seated Pelvic tilts x 10 -   LTR 10 x 10 sec Supine SKTC stretch 3 x 10 sec each Posterior pelvic tilt 15 x 5 sec- mod cues for correct technique and breathing- cues not to jerk  Supine clamshell with red 15 x 5 sec Modalities: Electrical Stim :IFC x 15 min to tolerance  HMP x 15 min concurrent with Estim (unable due to hydrocullator being cleaned)   08/29/2021 (  evaluation) LTR 5 x 5 sec Supine SKTC stretch 3 x 10 sec each Posterior pelvic tilt 5 x 5 sec Supine clamshell with red 5 x 5 sec Child's pose stretch 3 x 10 sec   PATIENT EDUCATION:  Education details: Importance of performing HEP, home TENS unit Person educated: Patient Education method: Explanation, Demonstration, Tactile cues, Verbal cues Education comprehension: verbalized understanding, returned demonstration, verbal cues required, tactile cues required, and needs further education   HOME EXERCISE PROGRAM: Access Code: [email or web address]     ASSESSMENT: CLINICAL  IMPRESSION: Patient with poor tolerance for therapy, no adverse effects reported. *** Patient did report feeling better post therapy and she would benefit from continued skilled PT in order to progress activity tolerance and reduce pain in order to maximize her functional ability.  Patient arrives reporting high level of low back pain and states all exercise increases pain so unable to progress her exercises this visit. Patient has not bee consistent with any HEP due to her eye pain, so she was encouraged to improve consistency in order to be able to progress with exercise. Continued to use modalities post therapy to reduce pain and pain was reinstructed on home TENS use if this is beneficial.     GOALS: Goals reviewed with patient? Yes   SHORT TERM GOALS:   STG Name Target Date Goal status  1 Patient will be I with initial HEP in order to progress with therapy. Baseline: patient not consistent with exercises 09/19/2021 ONGOING  2 PT will review FOTO with patient by 3rd visit in order to understand expected progress and outcome with therapy. Baseline: assessed at eval 09/19/2021 MET  09/09/21    LONG TERM GOALS:    LTG Name Target Date Goal status  1 Patient will be I with final HEP to maintain progress from PT. Baseline: provided at eval 10/10/2021 INITIAL  2 Patient will report >/= 61% status on FOTO to indicate improved functional ability. Baseline: 49% functional status 10/10/2021 INITIAL  3 Patient will demonstrate gross hip and core strength >/= 4-/5 MMT in order to improve standing tolerance Baseline:grossly 3-/5 MMT 10/10/2021 INITIAL  4 Patient will report pain level </= 5/10 to reduce function limitation with household tasks that incorporate bending Baseline: patient reports 10/10 pain 10/10/2021 INITIAL      PLAN: PT FREQUENCY: 1x/week   PT DURATION: 6 weeks   PLANNED INTERVENTIONS: Therapeutic exercises, Therapeutic activity, Neuro Muscular re-education, Balance training, Gait  training, Patient/Family education, Joint mobilization, Aquatic Therapy, Dry Needling, Electrical stimulation, Cryotherapy, Moist heat, Traction, and Manual therapy   PLAN FOR NEXT SESSION: assess response to estim- try HMP, Review HEP and progress PRN, manual/dry needling for lumbar paraspinals, modalities for pain reduction, lumbar/hip stretching, gradual progression of core control and hip strength, postural control    Hilda Blades, PT, DPT, LAT, ATC 09/20/21  10:22 AM Phone: [phone] Fax: [phone]

## 2021-09-21 ENCOUNTER — Ambulatory Visit: Payer: Commercial Managed Care - HMO | Admitting: Physical Therapy

## 2021-09-28 ENCOUNTER — Ambulatory Visit: Payer: Commercial Managed Care - HMO | Admitting: Physical Therapy

## 2021-09-28 NOTE — Therapy (Incomplete)
OUTPATIENT PHYSICAL THERAPY TREATMENT NOTE   Patient Name: Darlene Soto MRN: [id number] DOB:[birth year], 56 y.o., female Today's Date: 09/28/2021  PCP: Merryl Hacker, No REFERRING PROVIDER: Newman Pies, MD      Past Medical History:  Diagnosis Date   Hypertension    Past Surgical History:  Procedure Laterality Date   CESAREAN SECTION     KNEE SURGERY     SKIN GRAFT     Patient Active Problem List   Diagnosis Date Noted   Strain of lumbar region 07/29/2020   Acute bilateral low back pain without sciatica 07/29/2020   Vaginal discharge 07/29/2020    REFERRING DIAG: Lumbago with sciatica, right side  THERAPY DIAG:  No diagnosis found.  PERTINENT HISTORY: None  PRECAUTIONS: None  ONSET DATE: patient reports pain began in 1999 or 2000  SUBJECTIVE: ***  PAIN:  Are you having pain? Yes NPRS scale: 9/10 Pain location: Back Pain orientation: Right, Left, and Lower  PAIN TYPE: Chronic Pain description: Nagging Aggravating factors: Bending, sitting or standing too long Relieving factors: Hot water with epson salt, medication   OBJECTIVE:  PATIENT SURVEYS:  FOTO 49% functional status   MUSCLE LENGTH: Hamstring tightness noted, also limited by onset of low back pain Hip flexor/quad tightness noted, also limited by onset of low back pain   POSTURE:  Increased lumbar lordosis / anterior pelvic tilt   PALPATION: Tender to light palpation bilat lumbar paraspinals   LUMBAR AROM   AROM AROM  08/29/2021  Flexion Fingertips to ankles, minimal reversal of lordosis  Extension < 25%  Right lateral flexion 50%  Left lateral flexion 50%  Right rotation 50%  Left rotation 50%             Lumbar AROM limited secondary to pain   LE AROM/PROM: Bilateral hip PROM grossly limited secondary to onset of low back pain   LE MMT:   MMT Right 08/29/2021 Left 08/29/2021  Hip flexion 4- 4-  Hip extension 3- 3-  Hip abduction 3- 3-  Knee flexion 4 4  Knee extension 4 4     GAIT: Assistive device utilized: None Level of assistance: Complete Independence Comments: guarded, decreased trunk rotation, increased lumbar lordosis/anterior pelvic tilt   TODAY'S TREATMENT:  09/21/2021:  Therapeutic exercise: NuStep L4 x 5 min with UE/LE while taking subjective ***   09/14/2021:  Therapeutic Exercise NuStep L4 x 5 min with UE/LE while taking subjective Seated hamstring stretch 2 x 20 sec each LTR 5 x 10 sec each Supine SKTC stretch 3 x 15 seconds PPT 2 x 5 with 5 sec hold - using belt to pull patient into lumbar lordosis and cueing patient to push lumbar spine into belt Hooklying bent knee fall out alternating x 20 Hooklying clamshell with yellow 10 x 5 sec hold SLR partial range 2 x 5 each Modalities: E-stim: IFC x 15 min to lumbar spine, 80-150, intensity to tolerance MHP for lumabr spine in combination with e-stim  09/09/2021 Therapeutic Exercise: Nustep L3 UE/LE x 5 min Seated childs pose vs seated lumbar flexion x 3 Seated Pelvic tilts x 10 -   LTR 10 x 10 sec Supine SKTC stretch 3 x 10 sec each Posterior pelvic tilt 15 x 5 sec- mod cues for correct technique and breathing- cues not to jerk  Supine clamshell with red 15 x 5 sec Modalities: Electrical Stim :IFC x 15 min to tolerance  HMP x 15 min concurrent with Estim (unable due to hydrocullator being cleaned)   08/29/2021 (  evaluation) LTR 5 x 5 sec Supine SKTC stretch 3 x 10 sec each Posterior pelvic tilt 5 x 5 sec Supine clamshell with red 5 x 5 sec Child's pose stretch 3 x 10 sec   PATIENT EDUCATION:  Education details: Importance of performing HEP, home TENS unit Person educated: Patient Education method: Explanation, Demonstration, Tactile cues, Verbal cues Education comprehension: verbalized understanding, returned demonstration, verbal cues required, tactile cues required, and needs further education   HOME EXERCISE PROGRAM: Access Code: [email or web address]     ASSESSMENT: CLINICAL  IMPRESSION: Patient with poor tolerance for therapy, no adverse effects reported. *** Patient did report feeling better post therapy and she would benefit from continued skilled PT in order to progress activity tolerance and reduce pain in order to maximize her functional ability.  Patient arrives reporting high level of low back pain and states all exercise increases pain so unable to progress her exercises this visit. Patient has not bee consistent with any HEP due to her eye pain, so she was encouraged to improve consistency in order to be able to progress with exercise. Continued to use modalities post therapy to reduce pain and pain was reinstructed on home TENS use if this is beneficial.     GOALS: Goals reviewed with patient? Yes   SHORT TERM GOALS:   STG Name Target Date Goal status  1 Patient will be I with initial HEP in order to progress with therapy. Baseline: patient not consistent with exercises 09/19/2021 ONGOING  2 PT will review FOTO with patient by 3rd visit in order to understand expected progress and outcome with therapy. Baseline: assessed at eval 09/19/2021 MET  09/09/21    LONG TERM GOALS:    LTG Name Target Date Goal status  1 Patient will be I with final HEP to maintain progress from PT. Baseline: provided at eval 10/10/2021 INITIAL  2 Patient will report >/= 61% status on FOTO to indicate improved functional ability. Baseline: 49% functional status 10/10/2021 INITIAL  3 Patient will demonstrate gross hip and core strength >/= 4-/5 MMT in order to improve standing tolerance Baseline:grossly 3-/5 MMT 10/10/2021 INITIAL  4 Patient will report pain level </= 5/10 to reduce function limitation with household tasks that incorporate bending Baseline: patient reports 10/10 pain 10/10/2021 INITIAL      PLAN: PT FREQUENCY: 1x/week   PT DURATION: 6 weeks   PLANNED INTERVENTIONS: Therapeutic exercises, Therapeutic activity, Neuro Muscular re-education, Balance training, Gait  training, Patient/Family education, Joint mobilization, Aquatic Therapy, Dry Needling, Electrical stimulation, Cryotherapy, Moist heat, Traction, and Manual therapy   PLAN FOR NEXT SESSION: assess response to estim- try HMP, Review HEP and progress PRN, manual/dry needling for lumbar paraspinals, modalities for pain reduction, lumbar/hip stretching, gradual progression of core control and hip strength, postural control    Hilda Blades, PT, DPT, LAT, ATC 09/28/21  8:29 AM Phone: [phone] Fax: [phone]

## 2022-02-11 ENCOUNTER — Encounter (HOSPITAL_COMMUNITY): Payer: Self-pay | Admitting: Physician Assistant

## 2022-02-11 ENCOUNTER — Ambulatory Visit (INDEPENDENT_AMBULATORY_CARE_PROVIDER_SITE_OTHER): Payer: Managed Care, Other (non HMO)

## 2022-02-11 ENCOUNTER — Ambulatory Visit (HOSPITAL_COMMUNITY)
Admission: EM | Admit: 2022-02-11 | Discharge: 2022-02-11 | Disposition: A | Discharge status: Home / Self Care | Payer: Managed Care, Other (non HMO) | Attending: Physician Assistant | Admitting: Physician Assistant

## 2022-02-11 DIAGNOSIS — M25562 Pain in left knee: Secondary | ICD-10-CM

## 2022-02-11 DIAGNOSIS — W19XXXA Unspecified fall, initial encounter: Secondary | ICD-10-CM

## 2022-02-11 DIAGNOSIS — M25561 Pain in right knee: Secondary | ICD-10-CM

## 2022-02-11 DIAGNOSIS — Z9889 Other specified postprocedural states: Secondary | ICD-10-CM

## 2022-02-11 DIAGNOSIS — I10 Essential (primary) hypertension: Secondary | ICD-10-CM

## 2022-02-11 MED ORDER — AMLODIPINE BESYLATE 10 MG PO TABS: 10.0000 mg | ORAL_TABLET | Freq: Every day | ORAL | 1 refills | Status: DC

## 2022-02-11 MED ORDER — HYDROCODONE-ACETAMINOPHEN 5-325 MG PO TABS: 1.0000 | ORAL_TABLET | Freq: Two times a day (BID) | ORAL | 0 refills | Status: AC | PRN

## 2022-02-11 NOTE — ED Triage Notes (Signed)
Pt reports falling last night on the concrete floor. She is c/o bilateral knee pain.

## 2022-02-11 NOTE — Discharge Instructions (Signed)
There were some abnormalities on your x-rays.  I would like you to try limiting how much weight you are bearing particularly on the left knee.  Use braces for comfort.  We cannot use NSAIDs (aspirin, ibuprofen/Advil, naproxen/Aleve) due to your blood pressure.  I have called in a few doses of hydrocodone.  Please cut this in half and use this as infrequently as possible.  Do not drive or drink alcohol taking this medication.  Rest your knees and keep them elevated.  Follow-up with orthopedics first thing Monday morning; call to schedule an appointment.  If anything worsens go to the emergency room.  Your blood pressure is elevated.  Please start amlodipine 10 mg daily.  Avoid NSAIDs including aspirin, ibuprofen/Advil, naproxen/Aleve.  Avoid caffeine, sodium, decongestants.  If you develop any chest pain, shortness of breath, headache, vision change, dizziness in the setting of high blood pressure you need to go to the emergency room.  We will try to establish you with your primary care provider within the next few weeks.  If you are unable to see them please return here for blood pressure recheck.

## 2022-02-11 NOTE — ED Provider Notes (Signed)
[facility]    CSN: [id number] Arrival date & time: 02/11/22  1258      History   Chief Complaint No chief complaint on file.   HPI Darlene Soto is a 56 y.o. female.   Patient presents today with a 1 day history of bilateral knee pain following injury.  Reports that she tripped over a small wall causing her to fall forward with the majority of her weight on both knees.  Since that time she has had ongoing pain which is currently rated 10 on a 0-10 pain scale, described as sharp, localized to anterior knee bilaterally, no aggravating or alleviating factors identified.  She has tried [employer] with last 1 earlier today without significant improvement.  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  Denies injury or previous surgery to left knee.  She is able to ambulate unassisted though it is painful.  She denies any swelling, weakness, numbness, paresthesias.  She did not hit her head and denies any loss of consciousness, headache, dizziness, nausea, vomiting.  Patient was noted to have very elevated blood pressure in clinic today.  She denies any chest pain, shortness of breath, headache, dizziness, vision change.  She was previously on antihypertensive medication but is not taking this for approximately 2 years so she has not been in medical care during this timeframe.  She has taken Goody powders recently and wonders if this could be elevating her blood pressure.  Denies any increase in sodium or caffeine consumption.  She does not currently have a PCP but is open to establishing with someone.    Past Medical History:  Diagnosis Date   Hypertension     Patient Active Problem List   Diagnosis Date Noted   Strain of lumbar region 07/29/2020   Acute bilateral low back pain without sciatica 07/29/2020   Vaginal discharge 07/29/2020    Past Surgical History:  Procedure Laterality Date   CESAREAN SECTION     KNEE SURGERY     SKIN GRAFT      OB History   No  obstetric history on file.      Home Medications    Prior to Admission medications   Medication Sig Start Date End Date Taking? Authorizing Provider  amLODipine (NORVASC) 10 MG tablet Take 1 tablet (10 mg total) by mouth daily. 02/11/22  Yes Quintavious Rinck, Noberto Retort, PA-C  HYDROcodone-acetaminophen (NORCO/VICODIN) 5-325 MG tablet Take 1 tablet by mouth 2 (two) times daily as needed for up to 2 days. 02/11/22 02/13/22 Yes Hatsue Sime, Noberto Retort, PA-C    Family History Family History  Family history unknown: Yes    Social History Social History   Tobacco Use   Smoking status: Heavy Smoker    Types: Cigarettes   Smokeless tobacco: Never  Substance Use Topics   Alcohol use: Yes   Drug use: Not Currently     Allergies   Patient has no known allergies.   Review of Systems Review of Systems  Constitutional:  Positive for activity change. Negative for appetite change, fatigue and fever.  Eyes:  Negative for visual disturbance.  Respiratory:  Negative for cough and shortness of breath.   Cardiovascular:  Negative for chest pain.  Musculoskeletal:  Positive for arthralgias and gait problem. Negative for myalgias.  Neurological:  Negative for dizziness, light-headedness and headaches.     Physical Exam Triage Vital Signs ED Triage Vitals  Enc Vitals Group     BP 02/11/22 1433 (!) 192/147  Pulse Rate 02/11/22 1433 93     Resp 02/11/22 1433 18     Temp 02/11/22 1433 (!) 97.2 F (36.2 C)     Temp Source 02/11/22 1433 Oral     SpO2 02/11/22 1433 98 %     Weight --      Height --      Head Circumference --      Peak Flow --      Pain Score 02/11/22 1434 10     Pain Loc --      Pain Edu? --      Excl. in GC? --    No data found.  Updated Vital Signs BP (!) 186/108 (BP Location: Left Arm)   Pulse 87   Temp 97.9 F (36.6 C) (Oral)   Resp 18   SpO2 96%   Visual Acuity Right Eye Distance:   Left Eye Distance:   Bilateral Distance:    Right Eye Near:   Left Eye Near:     Bilateral Near:     Physical Exam Vitals reviewed.  Constitutional:      General: She is awake. She is not in acute distress.    Appearance: Normal appearance. She is well-developed. She is not ill-appearing.     Comments: Very pleasant female appears stated age in no acute distress sitting comfortably in exam room  HENT:     Head: Normocephalic and atraumatic.  Cardiovascular:     Rate and Rhythm: Normal rate and regular rhythm.     Heart sounds: Normal heart sounds, S1 normal and S2 normal. No murmur heard. Pulmonary:     Effort: Pulmonary effort is normal.     Breath sounds: Normal breath sounds. No wheezing, rhonchi or rales.     Comments: Clear to auscultation bilaterally Abdominal:     Palpations: Abdomen is soft.     Tenderness: There is no abdominal tenderness.  Musculoskeletal:     Right knee: Bony tenderness present. Tenderness present. No LCL laxity, MCL laxity, ACL laxity or PCL laxity.     Left knee: Bony tenderness present. Tenderness present. No LCL laxity, MCL laxity, ACL laxity or PCL laxity.    Right lower leg: No edema.     Left lower leg: No edema.     Comments: Tenderness palpation over patella and lateral epicondyle of both femurs.  No deformity noted.  Strength 5/5 bilateral lower extremities.  No ligamentous laxity on exam.  Psychiatric:        Behavior: Behavior is cooperative.      UC Treatments / Results  Labs (all labs ordered are listed, but only abnormal results are displayed) Labs Reviewed - No data to display  EKG   Radiology DG Knee Complete 4 Views Right  Result Date: 02/11/2022 CLINICAL DATA:  Right knee pain after fall EXAM: RIGHT KNEE - COMPLETE 4+ VIEW COMPARISON:  None Available. FINDINGS: Prior patellar ORIF with 2 partially threaded screws. Hardware appears intact. No patellar fracture. Patella baja alignment which is likely exaggerated by the degree of flexion on the lateral view as patella appears normally positioned on the AP and  sunrise views. No significant knee joint effusion is evident. No fractures are seen. No soft tissue swelling. IMPRESSION: 1. No acute fracture or dislocation of the right knee. 2. Patella baja alignment which is likely exaggerated by the degree of flexion on the lateral view. Correlate with physical exam to exclude the possibility of a quadriceps tendon injury. 3. Prior patellar ORIF without evidence  of hardware complication. Electronically Signed   By: Duanne Guess D.O.   On: 02/11/2022 15:13   DG Knee Complete 4 Views Left  Result Date: 02/11/2022 CLINICAL DATA:  Fall, left knee pain EXAM: LEFT KNEE - COMPLETE 4+ VIEW COMPARISON:  None Available. FINDINGS: Moderate-sized left knee joint effusion without visible layering fat-fluid level. There is a 3 mm mineralized density along the medial margin of the intercondylar notch which may represent a tiny loose body. A small avulsion fracture fragment is not entirely excluded. Otherwise, no evidence of fracture. No malalignment. Soft tissue swelling about the knee. IMPRESSION: 1. Moderate-sized left knee joint effusion. 2. 3 mm mineralized density along the medial margin of the intercondylar notch which may represent a tiny loose body. A small avulsion fracture fragment is not entirely excluded. Electronically Signed   By: Duanne Guess D.O.   On: 02/11/2022 15:10    Procedures Procedures (including critical care time)  Medications Ordered in UC Medications - No data to display  Initial Impression / Assessment and Plan / UC Course  I have reviewed the triage vital signs and the nursing notes.  Pertinent labs & imaging results that were available during my care of the patient were reviewed by me and considered in my medical decision making (see chart for details).     X-rays obtained given mechanism of injury showed high riding patella but no acute osseous abnormality on the right with possible avulsion fracture on the left.  Patient was placed in  hinged knee brace bilaterally and given crutches with instruction to minimize weightbearing on the left.  Discussed that she should use RICE protocol for additional symptom relief.  She is unable to take NSAIDs due to hypertension in clinic today.  She was given a few doses of hydrocodone for pain relief as she reports tramadol is ineffective.  Reviewed risk on a controlled substance database shows no inappropriate refills.  Discussed this is sedating and can increase her risk of falls and she needs to be very careful with this medication.  She is not to drive or drink alcohol with this medication as drowsiness is a common side effect.  She is to follow-up with orthopedics first thing Monday.  She was given COVID information for local provider with instruction to call to schedule an appointment.  If she has any worsening symptoms she is to go to the emergency room.  Work excuse note provided.  Blood pressure is very elevated.  She denies any signs/symptoms of endorgan damage.  She was instructed to avoid NSAIDs, caffeine, sodium, decongestants.  We will restart amlodipine 10 mg.  We will try to establish with primary care via PCP assistance.  She is to monitor her blood pressure at home.  If she is unable to see PCP within a few weeks she is to return for reevaluation so we can adjust her medication.  Discussed that if she develops any chest pain, shortness of breath, headache, vision change, dizziness in setting of high blood pressure she needs to go to the emergency room.  Final Clinical Impressions(s) / UC Diagnoses   Final diagnoses:  Acute pain of both knees  Fall, initial encounter  History of knee surgery  Elevated blood pressure reading with diagnosis of hypertension     Discharge Instructions      There were some abnormalities on your x-rays.  I would like you to try limiting how much weight you are bearing particularly on the left knee.  Use braces for  comfort.  We cannot use NSAIDs  (aspirin, ibuprofen/Advil, naproxen/Aleve) due to your blood pressure.  I have called in a few doses of hydrocodone.  Please cut this in half and use this as infrequently as possible.  Do not drive or drink alcohol taking this medication.  Rest your knees and keep them elevated.  Follow-up with orthopedics first thing Monday morning; call to schedule an appointment.  If anything worsens go to the emergency room.  Your blood pressure is elevated.  Please start amlodipine 10 mg daily.  Avoid NSAIDs including aspirin, ibuprofen/Advil, naproxen/Aleve.  Avoid caffeine, sodium, decongestants.  If you develop any chest pain, shortness of breath, headache, vision change, dizziness in the setting of high blood pressure you need to go to the emergency room.  We will try to establish you with your primary care provider within the next few weeks.  If you are unable to see them please return here for blood pressure recheck.     ED Prescriptions     Medication Sig Dispense Auth. Provider   amLODipine (NORVASC) 10 MG tablet Take 1 tablet (10 mg total) by mouth daily. 30 tablet Jeanmarc Viernes K, PA-C   HYDROcodone-acetaminophen (NORCO/VICODIN) 5-325 MG tablet Take 1 tablet by mouth 2 (two) times daily as needed for up to 2 days. 4 tablet Kimmerly Lora, Noberto Retort, PA-C      PDMP not reviewed this encounter.   Jeani Hawking, PA-C 02/11/22 1534

## 2022-04-24 ENCOUNTER — Ambulatory Visit (INDEPENDENT_AMBULATORY_CARE_PROVIDER_SITE_OTHER): Payer: Managed Care, Other (non HMO) | Admitting: Primary Care

## 2022-05-25 ENCOUNTER — Ambulatory Visit (INDEPENDENT_AMBULATORY_CARE_PROVIDER_SITE_OTHER): Payer: Self-pay | Admitting: Primary Care

## 2022-07-26 ENCOUNTER — Ambulatory Visit (INDEPENDENT_AMBULATORY_CARE_PROVIDER_SITE_OTHER): Payer: Self-pay | Admitting: Primary Care

## 2022-07-27 ENCOUNTER — Other Ambulatory Visit: Payer: Self-pay

## 2022-07-27 ENCOUNTER — Encounter (HOSPITAL_COMMUNITY): Payer: Self-pay | Admitting: Emergency Medicine

## 2022-07-27 ENCOUNTER — Ambulatory Visit (HOSPITAL_COMMUNITY)
Admission: EM | Admit: 2022-07-27 | Discharge: 2022-07-27 | Disposition: A | Discharge status: Home / Self Care | Payer: Managed Care, Other (non HMO) | Attending: Internal Medicine | Admitting: Internal Medicine

## 2022-07-27 DIAGNOSIS — F17210 Nicotine dependence, cigarettes, uncomplicated: Secondary | ICD-10-CM | POA: Diagnosis not present

## 2022-07-27 DIAGNOSIS — R0789 Other chest pain: Secondary | ICD-10-CM | POA: Diagnosis not present

## 2022-07-27 DIAGNOSIS — I10 Essential (primary) hypertension: Secondary | ICD-10-CM | POA: Insufficient documentation

## 2022-07-27 DIAGNOSIS — R059 Cough, unspecified: Secondary | ICD-10-CM | POA: Diagnosis present

## 2022-07-27 DIAGNOSIS — Z1152 Encounter for screening for COVID-19: Secondary | ICD-10-CM | POA: Diagnosis not present

## 2022-07-27 DIAGNOSIS — J069 Acute upper respiratory infection, unspecified: Secondary | ICD-10-CM | POA: Diagnosis not present

## 2022-07-27 DIAGNOSIS — Z79899 Other long term (current) drug therapy: Secondary | ICD-10-CM | POA: Insufficient documentation

## 2022-07-27 DIAGNOSIS — R0981 Nasal congestion: Secondary | ICD-10-CM

## 2022-07-27 MED ORDER — IBUPROFEN 800 MG PO TABS
800.0000 mg | ORAL_TABLET | Freq: Once | ORAL | Status: AC
  Administered 2022-07-27: 800 mg via ORAL

## 2022-07-27 MED ORDER — BENZONATATE 100 MG PO CAPS: 100.0000 mg | ORAL_CAPSULE | Freq: Three times a day (TID) | ORAL | 0 refills | Status: DC

## 2022-07-27 MED ORDER — AMLODIPINE BESYLATE 10 MG PO TABS: 10.0000 mg | ORAL_TABLET | Freq: Every day | ORAL | 1 refills | Status: DC

## 2022-07-27 MED ORDER — IBUPROFEN 800 MG PO TABS
ORAL_TABLET | ORAL | Status: AC
  Filled 2022-07-27: qty 1

## 2022-07-27 MED ORDER — PROMETHAZINE-DM 6.25-15 MG/5ML PO SYRP: 5.0000 mL | ORAL_SOLUTION | Freq: Every evening | ORAL | 0 refills | Status: DC | PRN

## 2022-07-27 NOTE — ED Triage Notes (Addendum)
Symptoms started yesterday.  Family member has similar symptoms.    Complains of watery eyes , runny nose and chest congestion and pain with coughing.  Has taken robitussin and mucinex without any relief  Patient has been out of blood pressure medicine for one month and does not have a pcp

## 2022-07-27 NOTE — ED Provider Notes (Signed)
[facility]    CSN: [id number] Arrival date & time: 07/27/22  0950      History   Chief Complaint Chief Complaint  Patient presents with   URI    HPI Darlene Soto is a 56 y.o. female.   Patient presents urgent care for evaluation of dry cough, nasal congestion, generalized headache, and posttussive chest discomfort that started yesterday. Reports generalized body aches and intermittent chills as well.  Denies nausea, vomiting, dizziness, shortness of breath, left-sided chest pain, back pain, dizziness, weakness, and lightheadedness.  Denies decreased appetite and anosmia.  Sick contact with similar symptoms in the home.  No recent travel.  She is vaccinated against COVID-19 but has not received her influenza vaccine yet this year.  Denies history of chronic respiratory problems but she does smoke cigarettes daily, denies all other drug use.  No blurry vision, decreased visual acuity, or recent falls.  She has been taking Robitussin/Mucinex over-the-counter with some relief of her nasal congestion but reports cough has persisted.  Cough is nonproductive and worse at nighttime.  She would like a refill of her blood pressure medicine today.  She has been out of her amlodipine 10 mg once daily for 1 month and does not have a primary care provider.  She is requesting assistance establishing care with a primary care provider today.    URI   Past Medical History:  Diagnosis Date   Hypertension     Patient Active Problem List   Diagnosis Date Noted   Essential hypertension 07/27/2022   Strain of lumbar region 07/29/2020   Acute bilateral low back pain without sciatica 07/29/2020   Vaginal discharge 07/29/2020    Past Surgical History:  Procedure Laterality Date   CESAREAN SECTION     KNEE SURGERY     SKIN GRAFT      OB History   No obstetric history on file.      Home Medications    Prior to Admission medications   Medication Sig Start Date End Date  Taking? Authorizing Provider  benzonatate (TESSALON) 100 MG capsule Take 1 capsule (100 mg total) by mouth every 8 (eight) hours. 07/27/22  Yes Carlisle Beers, FNP  promethazine-dextromethorphan (PROMETHAZINE-DM) 6.25-15 MG/5ML syrup Take 5 mLs by mouth at bedtime as needed for cough. 07/27/22  Yes Carlisle Beers, FNP  amLODipine (NORVASC) 10 MG tablet Take 1 tablet (10 mg total) by mouth daily. Patient not taking: Reported on 07/27/2022 02/11/22   Raspet, Noberto Retort, PA-C    Family History Family History  Family history unknown: Yes    Social History Social History   Tobacco Use   Smoking status: Heavy Smoker    Types: Cigarettes   Smokeless tobacco: Never  Vaping Use   Vaping Use: Never used  Substance Use Topics   Alcohol use: Yes   Drug use: Not Currently     Allergies   Aspirin   Review of Systems Review of Systems Per HPI  Physical Exam Triage Vital Signs ED Triage Vitals  Enc Vitals Group     BP 07/27/22 1058 (!) 162/94     Pulse Rate 07/27/22 1058 98     Resp 07/27/22 1058 (!) 22     Temp 07/27/22 1058 99.6 F (37.6 C)     Temp Source 07/27/22 1058 Oral     SpO2 07/27/22 1058 96 %     Weight --      Height --      Head Circumference --  Peak Flow --      Pain Score 07/27/22 1054 10     Pain Loc --      Pain Edu? --      Excl. in GC? --    No data found.  Updated Vital Signs BP (!) 162/94 (BP Location: Left Arm)   Pulse 98   Temp 99.6 F (37.6 C) (Oral)   Resp (!) 22   SpO2 96%   Visual Acuity Right Eye Distance:   Left Eye Distance:   Bilateral Distance:    Right Eye Near:   Left Eye Near:    Bilateral Near:     Physical Exam Vitals and nursing note reviewed.  Constitutional:      Appearance: She is ill-appearing. She is not toxic-appearing.  HENT:     Head: Normocephalic and atraumatic.     Right Ear: Hearing, tympanic membrane, ear canal and external ear normal. There is no impacted cerumen.     Left Ear: Hearing,  tympanic membrane, ear canal and external ear normal. There is no impacted cerumen.     Nose: Congestion present.     Mouth/Throat:     Lips: Pink.     Mouth: Mucous membranes are moist.     Pharynx: No posterior oropharyngeal erythema.  Eyes:     General: Lids are normal. Vision grossly intact. Gaze aligned appropriately.     Extraocular Movements: Extraocular movements intact.     Conjunctiva/sclera: Conjunctivae normal.  Cardiovascular:     Rate and Rhythm: Normal rate and regular rhythm.     Heart sounds: Normal heart sounds, S1 normal and S2 normal.  Pulmonary:     Effort: Pulmonary effort is normal. No respiratory distress.     Breath sounds: Normal breath sounds and air entry.  Abdominal:     General: Abdomen is flat. Bowel sounds are normal.     Palpations: Abdomen is soft.     Tenderness: There is no abdominal tenderness.  Musculoskeletal:     Cervical back: Neck supple.     Right lower leg: No edema.     Left lower leg: No edema.  Lymphadenopathy:     Cervical: Cervical adenopathy present.  Skin:    General: Skin is warm and dry.     Capillary Refill: Capillary refill takes less than 2 seconds.     Findings: No rash.  Neurological:     General: No focal deficit present.     Mental Status: She is alert and oriented to person, place, and time. Mental status is at baseline.     Cranial Nerves: No dysarthria or facial asymmetry.  Psychiatric:        Mood and Affect: Mood normal.        Speech: Speech normal.        Behavior: Behavior normal.        Thought Content: Thought content normal.        Judgment: Judgment normal.      UC Treatments / Results  Labs (all labs ordered are listed, but only abnormal results are displayed) Labs Reviewed  SARS CORONAVIRUS 2 (TAT 6-24 HRS)    EKG   Radiology No results found.  Procedures Procedures (including critical care time)  Medications Ordered in UC Medications  ibuprofen (ADVIL) tablet 800 mg (800 mg Oral  Given 07/27/22 1141)    Initial Impression / Assessment and Plan / UC Course  I have reviewed the triage vital signs and the nursing notes.  Pertinent labs &  imaging results that were available during my care of the patient were reviewed by me and considered in my medical decision making (see chart for details).   1. Viral URI with cough, nasal congestion Symptoms and physical exam consistent with a viral upper respiratory tract infection that will likely resolve with rest, fluids, and prescriptions for symptomatic relief. No indication for imaging today based on stable cardiopulmonary exam and hemodynamically stable vital signs. COVID-19 testing is pending.  We will call patient if this is positive.  Quarantine guidelines discussed. Currently on day 2 of symptoms and does qualify for antiviral therapy.   Patient given ibuprofen 800mg  in clinic today for headache. History of aspirin allergy, states she is allergic to the coating on aspirin and has been taking ibuprofen without problem since discovering aspirin allergy. Tessalon Perles and Promethazine DM sent to pharmacy for symptomatic relief to be taken as prescribed. Promethazine DM cough medication may be used as needed only at bedtime due to possible drowsiness side effect (no alcohol, working, or driving while taking this advised).  May continue taking guaifenesin to thin mucus and help with cough.  May use ibuprofen/tylenol over the counter for body aches, fever/chills, and overall discomfort associated with viral illness. Nonpharmacologic interventions for symptom relief provided and after visit summary below.   2. Essential hypertension Amlodipine 10mg  once daily sent to pharmacy. Patient is neurovascularly intact to baseline and without red flag signs/symptoms indicating need for ED referral. Discussed DASH diet and increased exercise to lower blood pressure as well. PCP assistance initiated today, patient advised to go through  self-scheduling to find PCP as well.   Strict ED/urgent care return precautions given.  Patient verbalizes understanding and agreement with plan.  Counseled patient regarding possible side effects and uses of all medications prescribed at today's visit.  Patient verbalizes understanding and agreement with plan.  All questions answered.  Patient discharged from urgent care in stable condition.        Final Clinical Impressions(s) / UC Diagnoses   Final diagnoses:  Viral URI with cough  Nasal congestion  Essential hypertension     Discharge Instructions      You have a viral upper respiratory infection.  COVID-19 testing is pending. We will call you with results if positive. If your COVID test is positive, you must stay at home until day 6 of symptoms. On day 6, you may go out into public and go back to work, but you must wear a mask until day 11 of symptoms to prevent spread to others.  Purchase mucinex (guaifenesin) 1200mg  and take this every 12 hours for the next few days to thin your nasal congestion and mucous so that you are able to get out of your body easier by coughing and blowing your nose. Drink plenty of water while taking this.  Take tessalon pearles every 8 hours as needed for cough.  Take Promethazine DM cough medication to help with your cough at nighttime so that you are able to sleep. Do not drive, drink alcohol, or go to work while taking this medication since it can make you sleepy. Only take this at nighttime.   You may take tylenol 1,000mg  and ibuprofen 600mg  every 6 hours with food as needed for fever/chills, sore throat, aches/pains, and inflammation associated with viral illness. Take this with food to avoid stomach upset.    You may do salt water and baking soda gargles every 4 hours as needed for your throat pain.  Please put 1  teaspoon of salt and 1/2 teaspoon of baking soda in 8 ounces of warm water then gargle and spit the water out. You may also put 1  tablespoon of honey in warm water and drink this to soothe your throat.  Place a humidifier in your room at night to help decrease dry air that can irritate your airway and cause you to have a sore throat and cough.  Please try to eat a well-balanced diet while you are sick so that your body gets proper nutrition to heal.  If you develop any new or worsening symptoms, please return.  If your symptoms are severe, please go to the emergency room.  Follow-up with your primary care provider for further evaluation and management of your symptoms as well as ongoing wellness visits.  I hope you feel better!       ED Prescriptions     Medication Sig Dispense Auth. Provider   benzonatate (TESSALON) 100 MG capsule Take 1 capsule (100 mg total) by mouth every 8 (eight) hours. 21 capsule Carlisle Beers, FNP   promethazine-dextromethorphan (PROMETHAZINE-DM) 6.25-15 MG/5ML syrup Take 5 mLs by mouth at bedtime as needed for cough. 118 mL Carlisle Beers, FNP      PDMP not reviewed this encounter.   Carlisle Beers, [place] 07/27/22 1148

## 2022-07-27 NOTE — Discharge Instructions (Signed)
You have a viral upper respiratory infection.  COVID-19 testing is pending. We will call you with results if positive. If your COVID test is positive, you must stay at home until day 6 of symptoms. On day 6, you may go out into public and go back to work, but you must wear a mask until day 11 of symptoms to prevent spread to others.  Purchase mucinex (guaifenesin) 1200mg  and take this every 12 hours for the next few days to thin your nasal congestion and mucous so that you are able to get out of your body easier by coughing and blowing your nose. Drink plenty of water while taking this.  Take tessalon pearles every 8 hours as needed for cough.  Take Promethazine DM cough medication to help with your cough at nighttime so that you are able to sleep. Do not drive, drink alcohol, or go to work while taking this medication since it can make you sleepy. Only take this at nighttime.   You may take tylenol 1,000mg  and ibuprofen 600mg  every 6 hours with food as needed for fever/chills, sore throat, aches/pains, and inflammation associated with viral illness. Take this with food to avoid stomach upset.    You may do salt water and baking soda gargles every 4 hours as needed for your throat pain.  Please put 1 teaspoon of salt and 1/2 teaspoon of baking soda in 8 ounces of warm water then gargle and spit the water out. You may also put 1 tablespoon of honey in warm water and drink this to soothe your throat.  Place a humidifier in your room at night to help decrease dry air that can irritate your airway and cause you to have a sore throat and cough.  Please try to eat a well-balanced diet while you are sick so that your body gets proper nutrition to heal.  If you develop any new or worsening symptoms, please return.  If your symptoms are severe, please go to the emergency room.  Follow-up with your primary care provider for further evaluation and management of your symptoms as well as ongoing wellness visits.  I  hope you feel better!

## 2022-07-28 LAB — SARS CORONAVIRUS 2 (TAT 6-24 HRS): SARS Coronavirus 2: NEGATIVE

## 2022-12-19 IMAGING — DX DG LUMBAR SPINE COMPLETE 4+V
5 series · 5 of 5 positions shown · non-contrast
Comparison: None.

CLINICAL DATA: Intermittent low back pain for 1 month. No reported
injury.

EXAM:
LUMBAR SPINE - COMPLETE 4+ VIEW

[lumbar spine ap]
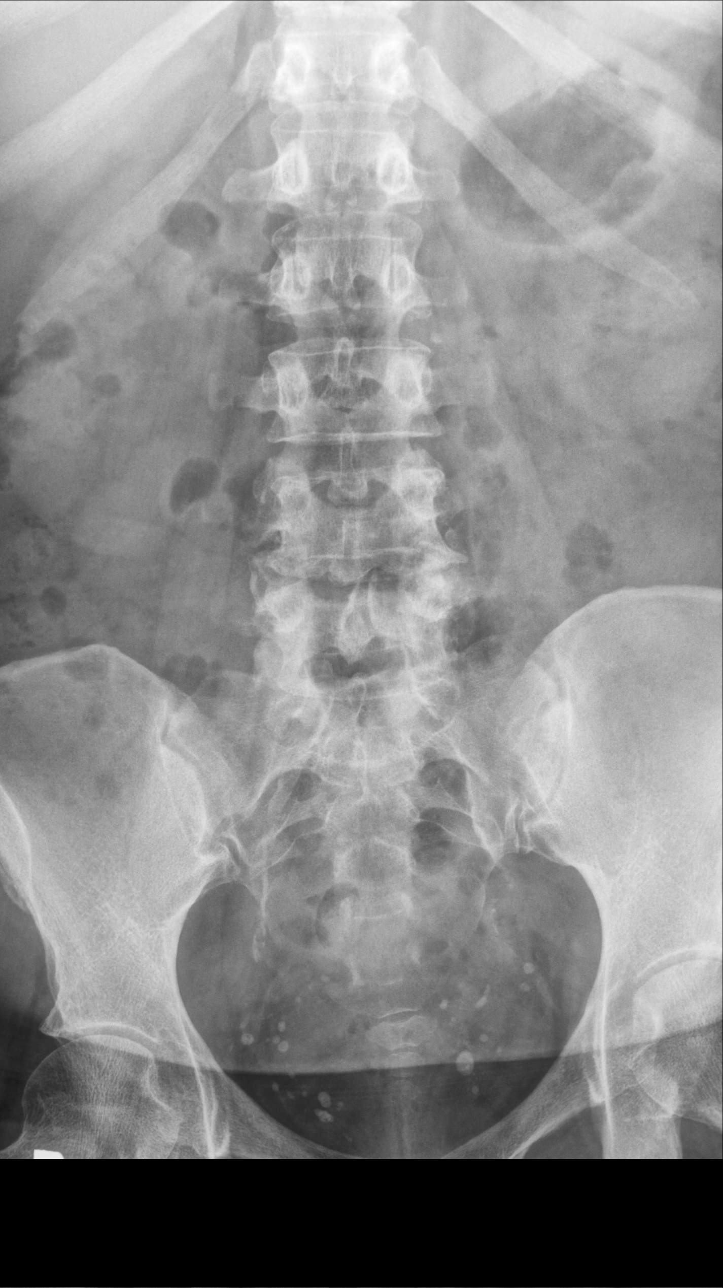

[lumbar spine oblique supine (1 of 2)]
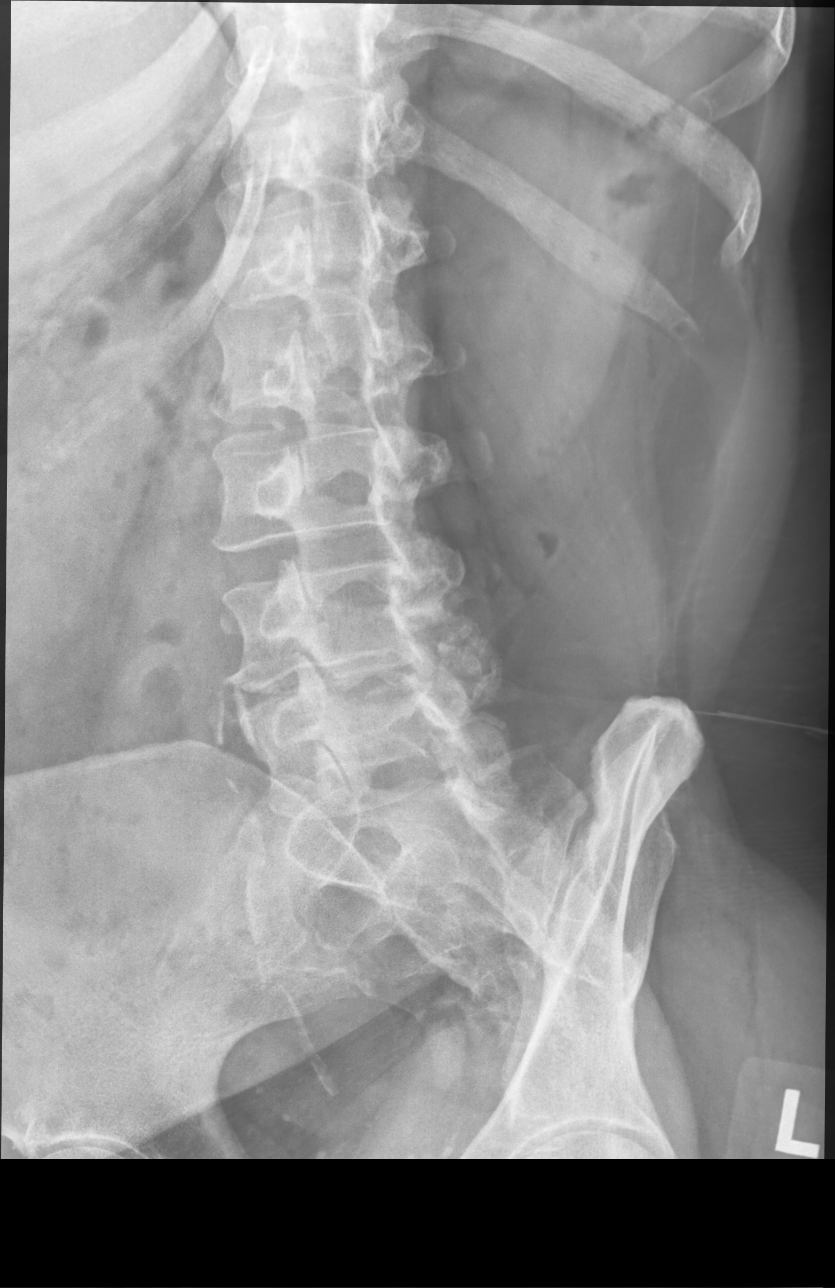

[lumbar spine oblique supine (2 of 2)]
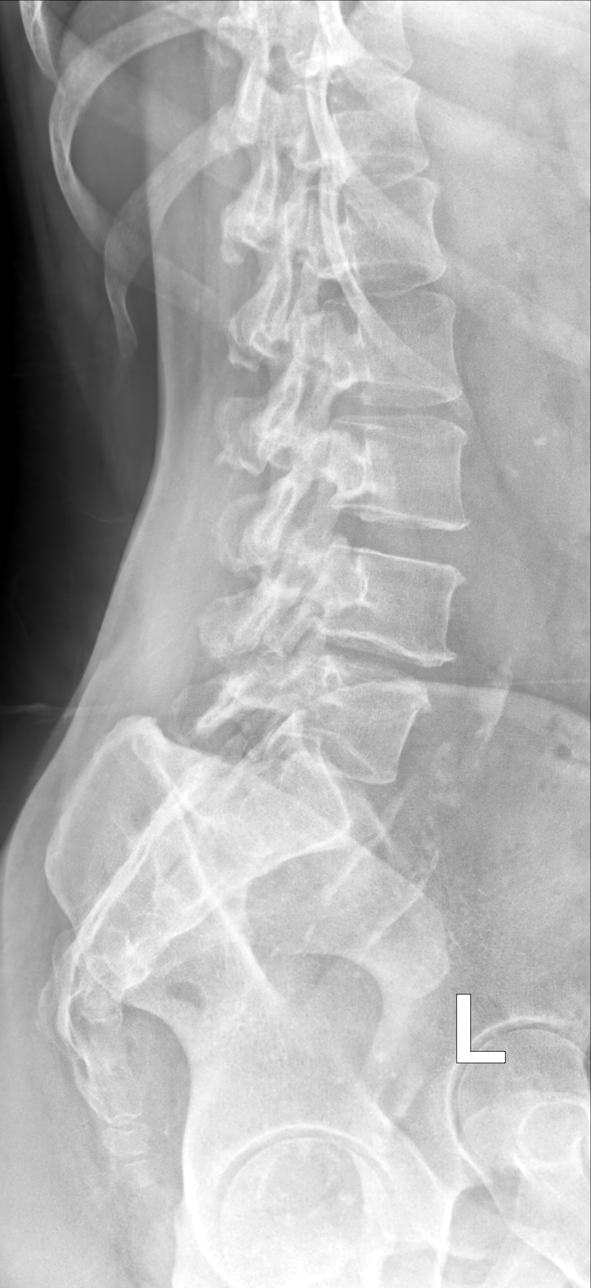

[lumbar spine lateral position lat]
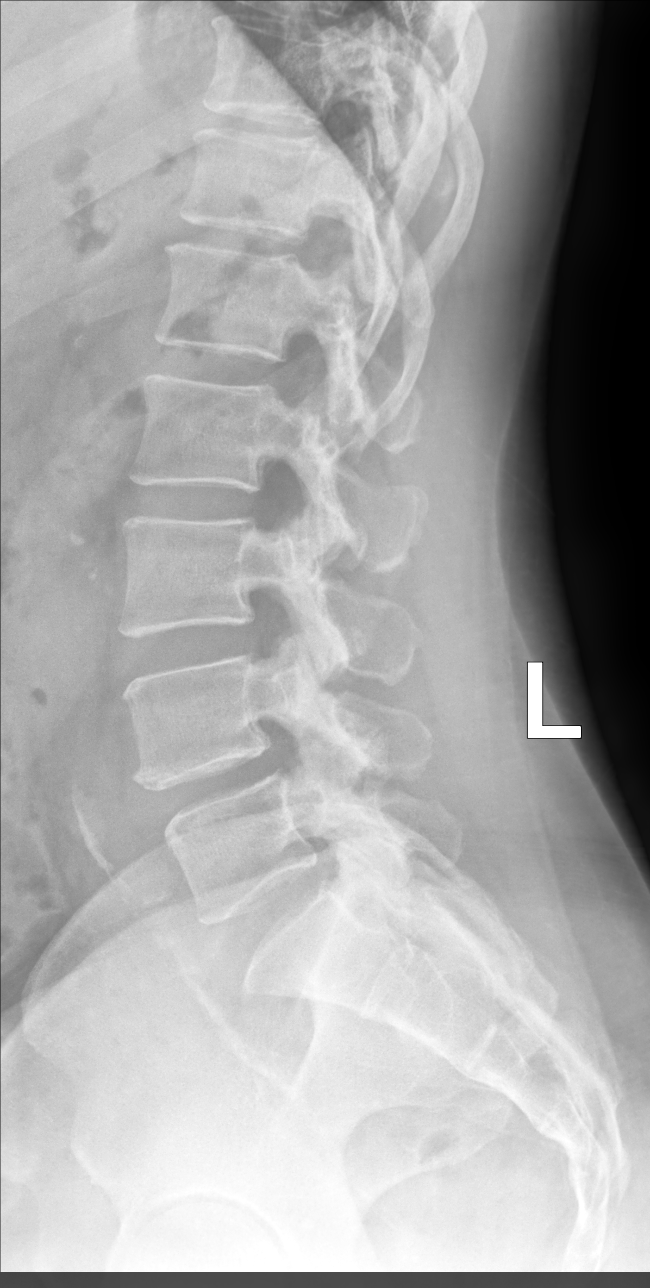

[lumbar spine l5-s1 spot]
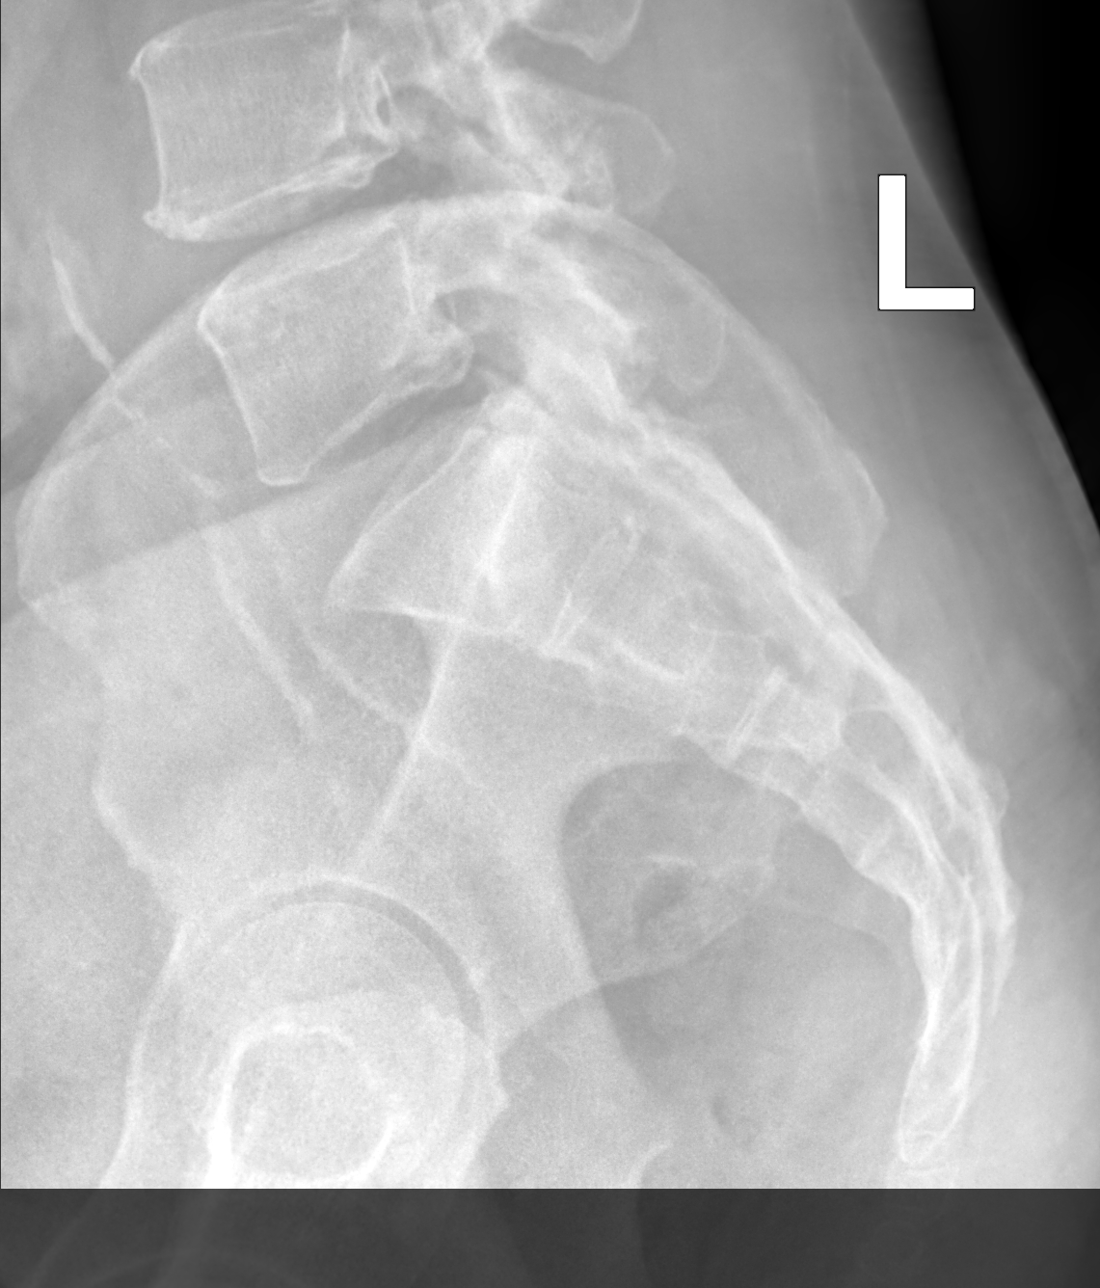

[5 of 5 positions shown; findings below may reference images not displayed]

FINDINGS: This report assumes 5 non rib-bearing lumbar vertebrae.

Lumbar vertebral body heights are preserved, with no fracture.

Mild multilevel lumbar degenerative disc disease, most prominent at
L4-5. Minimal 2 mm anterolisthesis at L4-5. Mild bilateral lower
lumbar facet arthropathy. No aggressive appearing focal osseous
lesions. Abdominal aortic atherosclerosis.
IMPRESSION: 1. Mild multilevel lumbar degenerative disc disease, most prominent
at L4-5.
2. Minimal 2 mm anterolisthesis at L4-5.
3. Mild lower lumbar facet arthropathy.

## 2023-03-16 ENCOUNTER — Ambulatory Visit (INDEPENDENT_AMBULATORY_CARE_PROVIDER_SITE_OTHER): Payer: 59 | Admitting: Podiatry

## 2023-03-16 ENCOUNTER — Ambulatory Visit (INDEPENDENT_AMBULATORY_CARE_PROVIDER_SITE_OTHER): Payer: 59

## 2023-03-16 ENCOUNTER — Encounter: Payer: Self-pay | Admitting: Podiatry

## 2023-03-16 DIAGNOSIS — M205X2 Other deformities of toe(s) (acquired), left foot: Secondary | ICD-10-CM | POA: Diagnosis not present

## 2023-03-16 DIAGNOSIS — M659 Synovitis and tenosynovitis, unspecified: Secondary | ICD-10-CM | POA: Diagnosis not present

## 2023-03-16 DIAGNOSIS — L600 Ingrowing nail: Secondary | ICD-10-CM | POA: Diagnosis not present

## 2023-03-16 DIAGNOSIS — M65072 Abscess of tendon sheath, left ankle and foot: Secondary | ICD-10-CM | POA: Diagnosis not present

## 2023-03-16 NOTE — Patient Instructions (Addendum)

## 2023-03-19 NOTE — Progress Notes (Signed)
Subjective:   Patient ID: Darlene Soto, female   DOB: 57 y.o.   MRN: [id number]   HPI Patient presents with several different problems with 1 being a severely thickened damaged second nail left that is dystrophic and painful and the other being pain in the big toe joint left with reduced range of motion spur formation.  Patient does smoke approximate pack of cigarettes per day and is not active   Review of Systems  All other systems reviewed and are negative.       Objective:  Physical Exam Vitals and nursing note reviewed.  Constitutional:      Appearance: She is well-developed.  Pulmonary:     Effort: Pulmonary effort is normal.  Musculoskeletal:        General: Normal range of motion.  Skin:    General: Skin is warm.  Neurological:     Mental Status: She is alert.     Neurovascular status was found to be intact muscle strength was adequate range of motion moderately reduced subtalar midtarsal joint.  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  I have recommended removal of the second nail bed and I explained the procedure risk and patient wants this done understanding risk.  I then discussed the arthritis in the big toe joint eventual fusion implant may be necessary but at this time I went ahead did sterile prep and injected periarticular around the synovium 3 mg Kenalog 5 mg Xylocaine.  For the nail I allowed her to read consent form understanding complications and I went ahead anesthetized the left second toe 60 mg like Marcaine mixture sterile prep done using sterile instrumentation removed the second nail exposed matrix applied phenol 5 applications 30  seconds followed by alcohol lavage sterile dressing gave instructions on soaks leave dressing on 24 hours take it off earlier if throbbing were to occur encouraged to call questions concerns which may arise

## 2023-05-24 ENCOUNTER — Ambulatory Visit: Payer: 59 | Admitting: Orthopedic Surgery

## 2023-05-29 ENCOUNTER — Ambulatory Visit: Payer: 59 | Admitting: Orthopedic Surgery

## 2023-06-04 ENCOUNTER — Ambulatory Visit (INDEPENDENT_AMBULATORY_CARE_PROVIDER_SITE_OTHER): Payer: 59 | Admitting: Orthopedic Surgery

## 2023-06-04 ENCOUNTER — Other Ambulatory Visit (INDEPENDENT_AMBULATORY_CARE_PROVIDER_SITE_OTHER): Payer: Self-pay

## 2023-06-04 ENCOUNTER — Encounter: Payer: Self-pay | Admitting: Orthopedic Surgery

## 2023-06-04 DIAGNOSIS — G8929 Other chronic pain: Secondary | ICD-10-CM

## 2023-06-04 DIAGNOSIS — M545 Low back pain, unspecified: Secondary | ICD-10-CM | POA: Diagnosis not present

## 2023-06-04 DIAGNOSIS — M25561 Pain in right knee: Secondary | ICD-10-CM

## 2023-06-04 MED ORDER — PREDNISONE 10 MG PO TABS: 10.0000 mg | ORAL_TABLET | Freq: Every day | ORAL | 0 refills | Status: AC

## 2023-06-04 MED ORDER — LIDOCAINE HCL (PF) 1 % IJ SOLN
5.0000 mL | INTRAMUSCULAR | Status: AC | PRN
  Administered 2023-06-04: 5 mL

## 2023-06-04 MED ORDER — METHYLPREDNISOLONE ACETATE 40 MG/ML IJ SUSP
40.0000 mg | INTRAMUSCULAR | Status: AC | PRN
  Administered 2023-06-04: 40 mg via INTRA_ARTICULAR

## 2023-06-04 NOTE — Progress Notes (Signed)
Office Visit Note   Patient: Darlene Soto           Date of Birth: 05/16/1966           MRN: [id number] Visit Date: 06/04/2023              Requested by: No referring provider defined for this encounter. PCP: Pcp, No  Chief Complaint  Patient presents with   Lower Back - Pain   Right Knee - Pain      HPI: Patient is a 57 year old woman who presents with midline lower back pain radiating to the paraspinous muscles.  No radicular pain down her legs.  Patient is status post open reduction internal fixation of a patella fracture and has been having patella femoral pain.  Assessment & Plan: Visit Diagnoses:  1. Chronic low back pain, unspecified back pain laterality, unspecified whether sciatica present   2. Chronic pain of right knee     Plan: Prescription provided for prednisone for the lumbar spine 10 mg with breakfast.  The right knee was injected.  Reevaluate in 4 weeks.  May require an MRI scan of the lumbar spine.  Follow-Up Instructions: No follow-ups on file.   Ortho Exam  Patient is alert, oriented, no adenopathy, well-dressed, normal affect, normal respiratory effort. Examination patient has no effusion of the right knee.  There is tenderness to palpation the patellofemoral joint and crepitation with range of motion.  Collaterals and cruciates are stable.  She has negative straight leg raise bilaterally and no focal motor weakness in either lower extremity.  Imaging: XR Lumbar Spine 2-3 Views  Result Date: 06/04/2023 2 view radiographs of the lumbar spine shows degenerative facet arthropathy with mild grade 1 spondylolisthesis L4-5.  XR KNEE 3 VIEW RIGHT  Result Date: 06/04/2023 Three-view radiographs of the right knee shows previous internal fixation for a patella fracture with osteoarthritis of the patellofemoral joint with osteophytic bone spurs  No images are attached to the encounter.  Labs: Lab Results  Component Value Date   REPTSTATUS 05/04/2021  FINAL 05/03/2021   CULT  05/03/2021    NO GROWTH Performed at [facility], [address], [place], [place] [address]      No results found for: "ALBUMIN", "PREALBUMIN", "CBC"  No results found for: "MG" No results found for: "VD25OH"  No results found for: "PREALBUMIN"     No data to display           There is no height or weight on file to calculate BMI.  Orders:  Orders Placed This Encounter  Procedures   XR Lumbar Spine 2-3 Views   XR KNEE 3 VIEW RIGHT   No orders of the defined types were placed in this encounter.    Procedures: Large Joint Inj: R knee on 06/04/2023 11:33 AM Indications: pain and diagnostic evaluation Details: 22 G 1.5 in needle, anteromedial approach  Arthrogram: No  Medications: 5 mL lidocaine (PF) 1 %; 40 mg methylPREDNISolone acetate 40 MG/ML Outcome: tolerated well, no immediate complications Procedure, treatment alternatives, risks and benefits explained, specific risks discussed. Consent was given by the patient. Immediately prior to procedure a time out was called to verify the correct patient, procedure, equipment, support staff and site/side marked as required. Patient was prepped and draped in the usual sterile fashion.      Clinical Data: No additional findings.  ROS:  All other systems negative, except as noted in the HPI. Review of Systems  Objective: Vital Signs: There  were no vitals taken for this visit.  Specialty Comments:  No specialty comments available.  PMFS History: Patient Active Problem List   Diagnosis Date Noted   Essential hypertension 07/27/2022   Strain of lumbar region 07/29/2020   Acute bilateral low back pain without sciatica 07/29/2020   Vaginal discharge 07/29/2020   Past Medical History:  Diagnosis Date   Hypertension     Family History  Family history unknown: Yes    Past Surgical History:  Procedure Laterality Date   CESAREAN SECTION     KNEE SURGERY     SKIN GRAFT      Social History   Occupational History   Not on file  Tobacco Use   Smoking status: Heavy Smoker    Types: Cigarettes   Smokeless tobacco: Never  Vaping Use   Vaping status: Never Used  Substance and Sexual Activity   Alcohol use: Yes   Drug use: Not Currently   Sexual activity: Not on file

## 2023-07-02 ENCOUNTER — Encounter: Payer: Self-pay | Admitting: Orthopedic Surgery

## 2023-07-02 ENCOUNTER — Ambulatory Visit (INDEPENDENT_AMBULATORY_CARE_PROVIDER_SITE_OTHER): Payer: 59 | Admitting: Orthopedic Surgery

## 2023-07-02 DIAGNOSIS — M545 Low back pain, unspecified: Secondary | ICD-10-CM | POA: Diagnosis not present

## 2023-07-02 DIAGNOSIS — G8929 Other chronic pain: Secondary | ICD-10-CM | POA: Diagnosis not present

## 2023-07-02 DIAGNOSIS — M25561 Pain in right knee: Secondary | ICD-10-CM

## 2023-07-02 NOTE — Progress Notes (Signed)
Office Visit Note   Patient: Darlene Soto           Date of Birth: 08-Dec-1965           MRN: [id number] Visit Date: 07/02/2023              Requested by: No referring provider defined for this encounter. PCP: Pcp, No  Chief Complaint  Patient presents with   Right Knee - Follow-up    S/p injection 20/21/2024   Lower Back - Follow-up      HPI: Patient is a 57 year old woman who is seen in follow-up for chronic lower back pain.  She has had symptoms for over 6 weeks.  She has been taking prednisone without relief.  Patient states that her back pain shoots to the fingers in her right hand and is caused her to drop things.  Patient is a smoker.  Patient has pain beneath the patella status post open reduction internal fixation.  Assessment & Plan: Visit Diagnoses:  1. Chronic low back pain, unspecified back pain laterality, unspecified whether sciatica present   2. 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  Recommended Voltaren gel and will set up physical therapy for strengthening.  Will request an MRI scan for the chronic lower back pain.  With patient's multiple orthopedic issues it is difficult for her to work and patient states she is applying for disability.  Discussed the importance of smoking cessation to help with the back symptoms.  Follow-Up Instructions: No follow-ups on file.   Ortho Exam  Patient is alert, oriented, no adenopathy, well-dressed, normal affect, normal respiratory effort. Examination patient has no focal motor weakness in either lower extremity she has a negative straight leg raise.  She is tender to palpation the patellofemoral joint of the right knee there is no effusion.  There is crepitation with range of motion.  Imaging: No results found. No images are attached to the encounter.  Labs: Lab Results  Component Value Date   REPTSTATUS 05/04/2021 FINAL 05/03/2021   CULT   05/03/2021    NO GROWTH Performed at [facility], [address], [place], [place] [address]      No results found for: "ALBUMIN", "PREALBUMIN", "CBC"  No results found for: "MG" No results found for: "VD25OH"  No results found for: "PREALBUMIN"     No data to display           There is no height or weight on file to calculate BMI.  Orders:  No orders of the defined types were placed in this encounter.  No orders of the defined types were placed in this encounter.    Procedures: No procedures performed  Clinical Data: No additional findings.  ROS:  All other systems negative, except as noted in the HPI. Review of Systems  Objective: Vital Signs: There were no vitals taken for this visit.  Specialty Comments:  No specialty comments available.  PMFS History: Patient Active Problem List   Diagnosis Date Noted   Essential hypertension 07/27/2022   Strain of lumbar region 07/29/2020   Acute bilateral low back pain without sciatica 07/29/2020   Vaginal discharge 07/29/2020   Past Medical History:  Diagnosis Date   Hypertension     Family History  Family history unknown: Yes    Past Surgical History:  Procedure Laterality Date   CESAREAN SECTION     KNEE SURGERY  SKIN GRAFT     Social History   Occupational History   Not on file  Tobacco Use   Smoking status: Heavy Smoker    Types: Cigarettes   Smokeless tobacco: Never  Vaping Use   Vaping status: Never Used  Substance and Sexual Activity   Alcohol use: Yes   Drug use: Not Currently   Sexual activity: Not on file

## 2023-07-19 ENCOUNTER — Encounter: Payer: Self-pay | Admitting: Podiatry

## 2023-07-19 ENCOUNTER — Ambulatory Visit (INDEPENDENT_AMBULATORY_CARE_PROVIDER_SITE_OTHER): Payer: 59 | Admitting: Podiatry

## 2023-07-19 DIAGNOSIS — L600 Ingrowing nail: Secondary | ICD-10-CM | POA: Diagnosis not present

## 2023-07-19 DIAGNOSIS — M205X2 Other deformities of toe(s) (acquired), left foot: Secondary | ICD-10-CM

## 2023-07-19 NOTE — Progress Notes (Signed)
Subjective:   Patient ID: Darlene Soto, female   DOB: 57 y.o.   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  For the left ultimate surgery may be necessary and I reviewed this with her but at this point I am going to try to give it 2 more months and see how it continues to respond to conservative care.  For the right I recommended removal of the nailbed patient wants this done and I explained procedure risk and patient wants surgery.  I went ahead infiltrated the right second toe 60 mg like Marcaine mixture sterile prep done using sterile instrumentation removed the nail exposed matrix applied phenol 3 applications 30 seconds followed by alcohol lavage sterile dressing gave instructions on soaks and to wear dressing 24 hours take it off earlier if throbbing were to occur and to call with questions concerns which may arise

## 2023-07-30 ENCOUNTER — Encounter: Payer: Self-pay | Admitting: *Deleted

## 2023-09-13 ENCOUNTER — Ambulatory Visit: Payer: 59 | Admitting: Podiatry

## 2024-06-10 ENCOUNTER — Ambulatory Visit (HOSPITAL_COMMUNITY)
Admission: EM | Admit: 2024-06-10 | Discharge: 2024-06-10 | Disposition: A | Discharge status: Home / Self Care | Payer: Self-pay

## 2024-06-10 ENCOUNTER — Telehealth (HOSPITAL_COMMUNITY): Payer: Self-pay | Admitting: *Deleted

## 2024-06-10 ENCOUNTER — Encounter (HOSPITAL_COMMUNITY): Payer: Self-pay | Admitting: *Deleted

## 2024-06-10 DIAGNOSIS — R1013 Epigastric pain: Secondary | ICD-10-CM

## 2024-06-10 DIAGNOSIS — I10 Essential (primary) hypertension: Secondary | ICD-10-CM

## 2024-06-10 MED ORDER — AMLODIPINE BESYLATE 10 MG PO TABS: 10.0000 mg | ORAL_TABLET | Freq: Every day | ORAL | 1 refills | Status: DC

## 2024-06-10 MED ORDER — PANTOPRAZOLE SODIUM 20 MG PO TBEC: 20.0000 mg | DELAYED_RELEASE_TABLET | Freq: Two times a day (BID) | ORAL | 0 refills | Status: DC

## 2024-06-10 NOTE — ED Provider Notes (Signed)
 [facility]    CSN: [id number] Arrival date & time: 06/10/24  1752      History   Chief Complaint Chief Complaint  Patient presents with   Abdominal Pain    HPI Darlene Soto is a 58 y.o. female.   Patient presents today due to stepping epigastric/left upper quadrant abdominal pain that started today.  Patient states that he feels similar to when she was diagnosed with ulcers previously.  Patient denies nausea, vomiting, diarrhea, or loss of appetite.  Patient also has extremely elevated blood pressure.  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  Patient was previously on amlodipine .  The history is provided by the patient.  Abdominal Pain   Past Medical History:  Diagnosis Date   Hypertension     Patient Active Problem List   Diagnosis Date Noted   Essential hypertension 07/27/2022   Strain of lumbar region 07/29/2020   Acute bilateral low back pain without sciatica 07/29/2020   Vaginal discharge 07/29/2020    Past Surgical History:  Procedure Laterality Date   CESAREAN SECTION     KNEE SURGERY     SKIN GRAFT      OB History   No obstetric history on file.      Home Medications    Prior to Admission medications   Medication Sig Start Date End Date Taking? Authorizing Provider  amLODipine  (NORVASC ) 10 MG tablet Take 1 tablet (10 mg total) by mouth daily. 06/10/24  Yes Andra Corean BROCKS, PA-C  pantoprazole (PROTONIX) 20 MG tablet Take 1 tablet (20 mg total) by mouth 2 (two) times daily before a meal. 06/10/24  Yes Andra Corean BROCKS, PA-C  predniSONE  (DELTASONE ) 10 MG tablet Take 1 tablet (10 mg total) by mouth daily with breakfast. 06/04/23   Harden Jerona GAILS, MD    Family History Family History  Family history unknown: Yes    Social History Social History   Tobacco Use   Smoking status: Heavy Smoker    Types: Cigarettes   Smokeless tobacco: Never  Vaping Use   Vaping status:  Never Used  Substance Use Topics   Alcohol use: Yes   Drug use: Not Currently     Allergies   Aspirin   Review of Systems Review of Systems  Gastrointestinal:  Positive for abdominal pain.     Physical Exam Triage Vital Signs ED Triage Vitals  Encounter Vitals Group     BP 06/10/24 1828 (!) 203/102     Girls Systolic BP Percentile --      Girls Diastolic BP Percentile --      Boys Systolic BP Percentile --      Boys Diastolic BP Percentile --      Pulse Rate 06/10/24 1828 90     Resp 06/10/24 1828 16     Temp 06/10/24 1828 98.4 F (36.9 C)     Temp Source 06/10/24 1828 Oral     SpO2 06/10/24 1828 98 %     Weight --      Height --      Head Circumference --      Peak Flow --      Pain Score 06/10/24 1827 10     Pain Loc --      Pain Education --      Exclude from Growth Chart --    No data found.  Updated Vital Signs BP (!) 202/97 (BP Location: Left Arm)  Pulse 90   Temp 98.4 F (36.9 C) (Oral)   Resp 16   SpO2 98%   Visual Acuity Right Eye Distance:   Left Eye Distance:   Bilateral Distance:    Right Eye Near:   Left Eye Near:    Bilateral Near:     Physical Exam Vitals and nursing note reviewed.  Constitutional:      General: She is not in acute distress.    Appearance: Normal appearance. She is not ill-appearing, toxic-appearing or diaphoretic.  Eyes:     General: No scleral icterus. Cardiovascular:     Rate and Rhythm: Normal rate and regular rhythm.     Heart sounds: Normal heart sounds.  Pulmonary:     Effort: Pulmonary effort is normal. No respiratory distress.     Breath sounds: Normal breath sounds. No wheezing or rhonchi.  Abdominal:     General: Abdomen is flat. Bowel sounds are normal.     Palpations: Abdomen is soft.     Tenderness: There is abdominal tenderness in the epigastric area and left upper quadrant. There is no right CVA tenderness or left CVA tenderness.  Neurological:     Mental Status: She is alert.      UC  Treatments / Results  Labs (all labs ordered are listed, but only abnormal results are displayed) Labs Reviewed - No data to display  EKG   Radiology No results found.  Procedures Procedures (including critical care time)  Medications Ordered in UC Medications - No data to display  Initial Impression / Assessment and Plan / UC Course  I have reviewed the triage vital signs and the nursing notes.  Pertinent labs & imaging results that were available during my care of the patient were reviewed by me and considered in my medical decision making (see chart for details).     Will patient Protonix 20 mg twice daily to assist with abdominal pain.  Patient will be prescribed amlodipine  10 mg to start and has been scheduled for an appointment with primary care. Final Clinical Impressions(s) / UC Diagnoses   Final diagnoses:  Abdominal pain, epigastric  Uncontrolled hypertension     Discharge Instructions      If you start experience vomiting, nausea, frequent loose bowels movements, fever, or chills we need to report to the ER  We also should report to the ER if we have shortness of breath, chest pain that feels like a small animal sitting on her chest, dizziness, blurred vision, or headache-that is the worst pain you have ever felt.     ED Prescriptions     Medication Sig Dispense Auth. Provider   pantoprazole (PROTONIX) 20 MG tablet Take 1 tablet (20 mg total) by mouth 2 (two) times daily before a meal. 60 tablet Andra Krabbe C, PA-C   amLODipine  (NORVASC ) 10 MG tablet Take 1 tablet (10 mg total) by mouth daily. 30 tablet Andra Krabbe BROCKS, PA-C      PDMP not reviewed this encounter.   Andra Krabbe BROCKS, PA-C 06/10/24 1918

## 2024-06-10 NOTE — Telephone Encounter (Signed)
Rx changed pharmacy  °

## 2024-06-10 NOTE — ED Triage Notes (Signed)
 Pt states she has mid abdominal pain which is worse when she pushes on it. She describes the pain as stabbing. She drank some milk thinking it would help but no relief.

## 2024-06-10 NOTE — ED Notes (Signed)
 Pcp appt made 07/22/2024 pt aware and given appt info.

## 2024-06-10 NOTE — Discharge Instructions (Signed)
 If you start experience vomiting, nausea, frequent loose bowels movements, fever, or chills we need to report to the ER  We also should report to the ER if we have shortness of breath, chest pain that feels like a small animal sitting on her chest, dizziness, blurred vision, or headache-that is the worst pain you have ever felt.

## 2024-07-25 ENCOUNTER — Ambulatory Visit: Payer: Self-pay | Admitting: Family

## 2024-07-25 ENCOUNTER — Encounter: Payer: Self-pay | Admitting: Family

## 2024-07-25 VITALS — BP 152/89 | HR 93 | Temp 98.4°F | Resp 16 | Ht 60.75 in | Wt 143.6 lb

## 2024-07-25 DIAGNOSIS — I10 Essential (primary) hypertension: Secondary | ICD-10-CM

## 2024-07-25 DIAGNOSIS — K219 Gastro-esophageal reflux disease without esophagitis: Secondary | ICD-10-CM

## 2024-07-25 DIAGNOSIS — Z7689 Persons encountering health services in other specified circumstances: Secondary | ICD-10-CM

## 2024-07-25 DIAGNOSIS — L0292 Furuncle, unspecified: Secondary | ICD-10-CM | POA: Diagnosis not present

## 2024-07-25 MED ORDER — PANTOPRAZOLE SODIUM 20 MG PO TBEC: 20.0000 mg | DELAYED_RELEASE_TABLET | Freq: Two times a day (BID) | ORAL | 0 refills | Status: AC

## 2024-07-25 MED ORDER — AMOXICILLIN-POT CLAVULANATE 875-125 MG PO TABS: 1.0000 | ORAL_TABLET | Freq: Two times a day (BID) | ORAL | 0 refills | Status: AC

## 2024-07-25 MED ORDER — AMLODIPINE BESYLATE 10 MG PO TABS: 10.0000 mg | ORAL_TABLET | Freq: Every day | ORAL | 0 refills | Status: AC

## 2024-07-25 MED ORDER — VALSARTAN 40 MG PO TABS: 40.0000 mg | ORAL_TABLET | Freq: Every day | ORAL | 0 refills | Status: AC

## 2024-07-25 NOTE — Progress Notes (Signed)
 Subjective:    Darlene Soto - 58 y.o. female MRN [id number]  Date of birth: 05-02-1966  HPI  Darlene Soto is to establish care.    Current issues and/or concerns: - Doing well on Amlodipine , no issues/concerns. She does not complain of red flag symptoms such as but not limited to chest pain, shortness of breath, worst headache of life, nausea/vomiting.  - Doing well on Pantoprazole , no issues/concerns.  - States boil between buttocks causing discomfort. Denies red flag symptoms.   ROS per HPI    Health Maintenance:   Health Maintenance Due  Topic Date Due   HIV Screening  Never done   Hepatitis C Screening  Never done   Cervical Cancer Screening (HPV/Pap Cotest)  Never done   Mammogram  Never done   Colonoscopy  Never done     Past Medical History: Patient Active Problem List   Diagnosis Date Noted   Essential hypertension 07/27/2022   Strain of lumbar region 07/29/2020   Acute bilateral low back pain without sciatica 07/29/2020   Vaginal discharge 07/29/2020      Social History   reports that she has been smoking cigarettes. She has never used smokeless tobacco. She reports current alcohol use. She reports that she does not currently use drugs.   Family History  Family history is unknown by patient.   Medications: reviewed and updated   Objective:   Physical Exam BP (!) 152/89   Pulse 93   Temp 98.4 F (36.9 C) (Oral)   Resp 16   Ht 5' 0.75 (1.543 m)   Wt 143 lb 9.6 oz (65.1 kg)   SpO2 97%   BMI 27.36 kg/m   Physical Exam HENT:     Head: Normocephalic and atraumatic.     Nose: Nose normal.     Mouth/Throat:     Mouth: Mucous membranes are moist.     Pharynx: Oropharynx is clear.  Eyes:     Extraocular Movements: Extraocular movements intact.     Conjunctiva/sclera: Conjunctivae normal.     Pupils: Pupils are equal, round, and reactive to light.  Cardiovascular:     Rate and Rhythm: Normal rate and regular rhythm.     Pulses: Normal  pulses.     Heart sounds: Normal heart sounds.  Pulmonary:     Effort: Pulmonary effort is normal.     Breath sounds: Normal breath sounds.  Genitourinary:    Comments: Patient declined.  Musculoskeletal:        General: Normal range of motion.     Cervical back: Normal range of motion and neck supple.  Neurological:     General: No focal deficit present.     Mental Status: She is alert and oriented to person, place, and time.  Psychiatric:        Mood and Affect: Mood normal.        Behavior: Behavior normal.     Assessment & Plan:  1. Encounter to establish care (Primary) - Patient presents today to establish care. During the interim follow-up with primary provider as scheduled.  - Return for annual physical examination, labs, and health maintenance. Arrive fasting meaning having no food for at least 8 hours prior to appointment. You may have only water or black coffee. Please take scheduled medications as normal.  2. Primary hypertension - Blood pressure not at goal during today's visit. Patient asymptomatic without chest pressure, chest pain, palpitations, shortness of breath, worst headache of life, and any additional red flag symptoms. -  Continue Amlodipine  as prescribed.  - Trial Valsartan as prescribed.  - Routine screening.  - Counseled on blood pressure goal of less than 130/80, low-sodium, DASH diet, medication compliance, and 150 minutes of moderate intensity exercise per week as tolerated. Counseled on medication adherence and adverse effects. - Follow-up with primary provider in 4 weeks or sooner if needed.  - amLODipine  (NORVASC ) 10 MG tablet; Take 1 tablet (10 mg total) by mouth daily.  Dispense: 90 tablet; Refill: 0 - valsartan (DIOVAN) 40 MG tablet; Take 1 tablet (40 mg total) by mouth daily.  Dispense: 90 tablet; Refill: 0 - Basic Metabolic Panel  3. Gastroesophageal reflux disease, unspecified whether esophagitis present - Continue Pantoprazole  as prescribed.  Counseled on medication adherence/adverse effects.  - Follow-up with primary provider as scheduled.  - pantoprazole  (PROTONIX ) 20 MG tablet; Take 1 tablet (20 mg total) by mouth 2 (two) times daily before a meal.  Dispense: 180 tablet; Refill: 0  4. Boil - Amoxicillin-Clavulanate as prescribed. Counseled on medication adherence/adverse effects.  - Patient declined referral to specialist.  - Follow-up with primary provider as scheduled.  - amoxicillin-clavulanate (AUGMENTIN) 875-125 MG tablet; Take 1 tablet by mouth 2 (two) times daily.  Dispense: 20 tablet; Refill: 0    Patient was given clear instructions to go to Emergency Department or return to medical center if symptoms don't improve, worsen, or new problems develop.The patient verbalized understanding.  I discussed the assessment and treatment plan with the patient. The patient was provided an opportunity to ask questions and all were answered. The patient agreed with the plan and demonstrated an understanding of the instructions.   The patient was advised to call back or seek an in-person evaluation if the symptoms worsen or if the condition fails to improve as anticipated.    Greig Chute, NP 07/25/2024, 2:45 PM Primary Care at [facility]

## 2024-07-25 NOTE — Progress Notes (Signed)
 Establish care, medication refill

## 2024-07-26 LAB — BASIC METABOLIC PANEL WITH GFR
BUN/Creatinine Ratio: 18 (ref 12–28)
BUN: 15 mg/dL (ref 8–27)
CO2: 18 mmol/L — ABNORMAL LOW (ref 20–29)
Calcium: 9.9 mg/dL (ref 8.7–10.3)
Chloride: 104 mmol/L (ref 96–106)
Creatinine, Ser: 0.83 mg/dL (ref 0.57–1.00)
Glucose: 88 mg/dL (ref 70–99)
Potassium: 3.9 mmol/L (ref 3.5–5.2)
Sodium: 141 mmol/L (ref 134–144)
eGFR: 81 mL/min/1.73 (ref >59)

## 2024-07-28 ENCOUNTER — Ambulatory Visit: Payer: Self-pay | Admitting: Family

## 2024-08-17 ENCOUNTER — Encounter (HOSPITAL_COMMUNITY): Payer: Self-pay

## 2024-08-17 ENCOUNTER — Ambulatory Visit (HOSPITAL_COMMUNITY): Admission: EM | Admit: 2024-08-17 | Discharge: 2024-08-17 | Disposition: A | Discharge status: Home / Self Care

## 2024-08-17 DIAGNOSIS — H109 Unspecified conjunctivitis: Secondary | ICD-10-CM

## 2024-08-17 MED ORDER — AZELASTINE HCL 0.05 % OP SOLN: 1.0000 [drp] | Freq: Two times a day (BID) | OPHTHALMIC | 1 refills | Status: AC

## 2024-08-17 MED ORDER — MOXIFLOXACIN HCL 0.5 % OP SOLN: 1.0000 [drp] | Freq: Three times a day (TID) | OPHTHALMIC | 0 refills | Status: AC

## 2024-08-17 NOTE — ED Triage Notes (Signed)
 Patient presents to the office for bilateral pink eye x 2 days.

## 2024-08-17 NOTE — Discharge Instructions (Signed)
" °  1. Bacterial conjunctivitis (Primary) - moxifloxacin  (VIGAMOX ) 0.5 % ophthalmic solution; Place 1 drop into both eyes 3 (three) times daily for 5 days.  Dispense: 3 mL; Refill: 0 - azelastine  (OPTIVAR ) 0.05 % ophthalmic solution; Place 1 drop into both eyes 2 (two) times daily.  Dispense: 6 mL; Refill: 1  -Continue to monitor symptoms for any change in severity if there is any escalation of current symptoms or development of new symptoms follow-up in ER for further evaluation and management. "

## 2024-08-17 NOTE — ED Provider Notes (Signed)
 " [facility]  Note:  This document was prepared using Dragon voice recognition software and may include unintentional dictation errors.  MRN: [id number] DOB: 05/02/1966  Subjective:   Darlene Soto is a 59 y.o. female presenting for bilateral conjunctival swelling, eye redness, watery drainage, purulent discharge x 2 days.  Patient denies any known exposure to anyone with bacterial conjunctivitis.  Patient denies wearing contacts.  No blurred vision, vision loss, pain with eye movement, fever.  Patient denies using any over-the-counter eyedrops to help with symptoms prior to arrival in urgent care.  Current Medications[1]   Allergies[2]  Past Medical History:  Diagnosis Date   Hypertension      Past Surgical History:  Procedure Laterality Date   CESAREAN SECTION     KNEE SURGERY     SKIN GRAFT      Family History  Family history unknown: Yes    Social History[3]  ROS Refer to HPI for ROS details.  Objective:    Vitals: BP (!) 197/101 (BP Location: Left Arm)   Pulse 95   Temp 98 F (36.7 C) (Oral)   Resp 18   SpO2 95%   Physical Exam Vitals and nursing note reviewed.  Constitutional:      General: She is not in acute distress.    Appearance: Normal appearance. She is well-developed. She is not ill-appearing or toxic-appearing.  HENT:     Head: Normocephalic and atraumatic.     Nose: Nose normal.     Mouth/Throat:     Mouth: Mucous membranes are moist.  Eyes:     General: Vision grossly intact. Gaze aligned appropriately. No allergic shiner or visual field deficit.       Right eye: Discharge present. No foreign body or hordeolum.        Left eye: Discharge present.No foreign body or hordeolum.     Extraocular Movements: Extraocular movements intact.     Right eye: Normal extraocular motion.     Left eye: Normal extraocular motion.     Conjunctiva/sclera:     Right eye: Right conjunctiva is injected. No exudate.    Left eye: Left  conjunctiva is injected. No exudate.    Pupils: Pupils are equal, round, and reactive to light.  Cardiovascular:     Rate and Rhythm: Normal rate.  Pulmonary:     Effort: Pulmonary effort is normal. No respiratory distress.  Musculoskeletal:        General: Normal range of motion.  Skin:    General: Skin is warm and dry.  Neurological:     General: No focal deficit present.     Mental Status: She is alert and oriented to person, place, and time.  Psychiatric:        Mood and Affect: Mood normal.        Behavior: Behavior normal.     Procedures  No results found for this or any previous visit (from the past 24 hours).  Assessment and Plan :     Discharge Instructions       1. Bacterial conjunctivitis (Primary) - moxifloxacin  (VIGAMOX ) 0.5 % ophthalmic solution; Place 1 drop into both eyes 3 (three) times daily for 5 days.  Dispense: 3 mL; Refill: 0 - azelastine  (OPTIVAR ) 0.05 % ophthalmic solution; Place 1 drop into both eyes 2 (two) times daily.  Dispense: 6 mL; Refill: 1  -Continue to monitor symptoms for any change in severity if there is any escalation of current symptoms or development of new symptoms follow-up  in ER for further evaluation and management.      Janeese Mcgloin B Zaylie Gisler    [1] No current facility-administered medications for this encounter.  Current Outpatient Medications:    amLODipine  (NORVASC ) 10 MG tablet, Take 1 tablet (10 mg total) by mouth daily., Disp: 90 tablet, Rfl: 0   azelastine  (OPTIVAR ) 0.05 % ophthalmic solution, Place 1 drop into both eyes 2 (two) times daily., Disp: 6 mL, Rfl: 1   moxifloxacin  (VIGAMOX ) 0.5 % ophthalmic solution, Place 1 drop into both eyes 3 (three) times daily for 5 days., Disp: 3 mL, Rfl: 0   pantoprazole  (PROTONIX ) 20 MG tablet, Take 1 tablet (20 mg total) by mouth 2 (two) times daily before a meal., Disp: 180 tablet, Rfl: 0   valsartan  (DIOVAN ) 40 MG tablet, Take 1 tablet (40 mg total) by mouth daily., Disp: 90  tablet, Rfl: 0   amoxicillin -clavulanate (AUGMENTIN ) 875-125 MG tablet, Take 1 tablet by mouth 2 (two) times daily., Disp: 20 tablet, Rfl: 0   predniSONE  (DELTASONE ) 10 MG tablet, Take 1 tablet (10 mg total) by mouth daily with breakfast. (Patient not taking: Reported on 07/25/2024), Disp: 30 tablet, Rfl: 0 [2]  Allergies Allergen Reactions   Aspirin   [3]  Social History Tobacco Use   Smoking status: Heavy Smoker    Types: Cigarettes   Smokeless tobacco: Never  Vaping Use   Vaping status: Never Used  Substance Use Topics   Alcohol use: Yes   Drug use: Not Currently     Aurea Ethel NOVAK, NP 08/17/24 1133  "

## 2024-08-29 ENCOUNTER — Encounter: Payer: Self-pay | Admitting: Family

## 2024-08-29 NOTE — Progress Notes (Signed)
 Erroneous encounter-disregard

## 2024-10-01 ENCOUNTER — Ambulatory Visit: Admitting: Family
